# Patient Record
Sex: Male | Born: 1945 | Race: White | Hispanic: No | Marital: Single | Smoking: Never smoker
Health system: Southern US, Community
[De-identification: ages and names within clinical notes are randomized; demographics above are authoritative.]

## PROBLEM LIST (undated history)

## (undated) DIAGNOSIS — I10 Essential (primary) hypertension: Secondary | ICD-10-CM

## (undated) DIAGNOSIS — R7301 Impaired fasting glucose: Secondary | ICD-10-CM

## (undated) DIAGNOSIS — K635 Polyp of colon: Secondary | ICD-10-CM

## (undated) DIAGNOSIS — M1990 Unspecified osteoarthritis, unspecified site: Secondary | ICD-10-CM

## (undated) HISTORY — DX: Essential (primary) hypertension: I10

## (undated) HISTORY — PX: COLONOSCOPY: SHX174

## (undated) HISTORY — DX: Polyp of colon: K63.5

## (undated) HISTORY — DX: Impaired fasting glucose: R73.01

## (undated) HISTORY — PX: OTHER SURGICAL HISTORY: SHX169

## (undated) HISTORY — DX: Unspecified osteoarthritis, unspecified site: M19.90

---

## 2002-01-28 ENCOUNTER — Ambulatory Visit (HOSPITAL_BASED_OUTPATIENT_CLINIC_OR_DEPARTMENT_OTHER): Admission: RE | Admit: 2002-01-28 | Discharge: 2002-01-28 | Payer: Self-pay | Admitting: Orthopedic Surgery

## 2002-01-28 ENCOUNTER — Encounter (INDEPENDENT_AMBULATORY_CARE_PROVIDER_SITE_OTHER): Payer: Self-pay | Admitting: Specialist

## 2004-05-02 ENCOUNTER — Ambulatory Visit: Payer: Self-pay | Admitting: Internal Medicine

## 2004-05-02 ENCOUNTER — Ambulatory Visit (HOSPITAL_COMMUNITY): Admission: RE | Admit: 2004-05-02 | Discharge: 2004-05-02 | Payer: Self-pay | Admitting: Internal Medicine

## 2007-01-07 ENCOUNTER — Ambulatory Visit (HOSPITAL_BASED_OUTPATIENT_CLINIC_OR_DEPARTMENT_OTHER): Admission: RE | Admit: 2007-01-07 | Discharge: 2007-01-08 | Payer: Self-pay | Admitting: Orthopedic Surgery

## 2007-01-07 ENCOUNTER — Encounter (INDEPENDENT_AMBULATORY_CARE_PROVIDER_SITE_OTHER): Payer: Self-pay | Admitting: Orthopedic Surgery

## 2010-10-08 NOTE — Op Note (Signed)
NAMEDEWAN, Richard Becker NO.:  [id number]   MEDICAL RECORD NO.:  [id number]          PATIENT TYPE:  AMB   LOCATION:  DSC                          FACILITY:  [facility]   PHYSICIAN:  Cindee Salt, M.D.       DATE OF BIRTH:  [birth year]   DATE OF PROCEDURE:  01/07/2007  DATE OF DISCHARGE:                               OPERATIVE REPORT   PREOPERATIVE DIAGNOSIS:  Dupuytren's contracture right middle, right  ring finger first web space right hand.   POSTOPERATIVE DIAGNOSIS:  Dupuytren's contracture right middle, right  ring finger first web space right hand.   OPERATION:  Excision palmar fascia with V-Y advancements right middle,  right ring first web space right hand.   ASSISTANT:  Carolyne Fiscal R.N.   ANESTHESIA:  Supraclavicular with general.   ANESTHESIOLOGIST:  Hart Robinsons, M.D.   HISTORY:  The patient is a 65 year old male with a history of  Dupuytren's contracture.  He has also developed a complex regional pain  following excision of his left side.  He is admitted now for removal of  palmar fascia of the middle ring fingers of his right hand with removal  of the cord to his first web space.  He is aware of risks and  complications including infection, recurrence, injury to arteries,  nerves, tendons complete relief of symptoms, dystrophy.  He will be  admitted for overnight stay for pain control and for a block in the  morning.   PROCEDURE IN DETAIL:  In the preoperative area the patient is seen.  The  extremity marked by both the patient and surgeon, questions encouraged  and answered, block given.  He was brought to the operating room where  general anesthetic was carried out without difficulty.  He was prepped  using DuraPrep, supine position, right arm free.  The limb was  exsanguinated with an Esmarch bandage.  Tourniquet placed on the upper  arm was inflated to 250 mmHg.  After adequate anesthesia was afforded, a  volar Brunner incision was made based over  the ring finger, carried down  through subcutaneous tissue.  Bleeders were electrocauterized.  Palmar  fascia was lifted from the superficial volar retinaculum.  This allowed  visualization of the neurovascular bundles beneath this.  The entire  fascia was released proximally.  The cord to the ring finger was then  followed tracing neurovascular bundles, maintaining them intact.  A  spiral nerve and artery was present to the ulnar side of the ring  finger.  This was carefully dissected free, maintained, the dissection  carried out to the PIP joint.  The natatory ligament cord to the middle  finger was also released.  The cord was excised en toto and sent to  pathology.  Neurovascular bundles were explored, found to be intact on  both radial and ulnar side.  A separate incision was then made over the  middle finger, carried down through subcutaneous tissue.  Bleeders again  electrocauterized.  The palmar fascia was isolated and neurovascular  structures protected.  The cord removed to the middle finger to the  proximal phalanx.  This  cord was also sent to pathology.  Separate  zigzag incision was made over the first web space and carried down  through subcutaneous tissue.  The transverse cord was immediately  apparent.  Neurovascular bundle to the index finger identified.  The  cord was then transected, removed and sent to pathology.  The wounds  were copiously irrigated with saline.  The V's were converted to Y's on  the ring finger.  The Vesi-Loops were placed to the base of the wounds  on the middle and ring, brought out proximally.  These were doubled.  The wounds were then closed with interrupted 5-0 Vicryl Rapide sutures  leaving gaps for drainage.  A sterile compressive dressing was applied.  Tourniquet deflated.  All fingers  immediately pinked.  A dorsal splint was then applied.  The patient was  taken to the recovery room for observation in satisfactory condition.  He will be  discharged home in the morning.  He is to be given another  stellate ganglion block prior to discharge.  He will be discharged on  Percocet and Elavil.           ______________________________  Cindee Salt, M.D.     GK/MEDQ  D:  01/07/2007  T:  01/08/2007  Job:  [id number]

## 2010-10-11 NOTE — Op Note (Signed)
[name], [name]             ACCOUNT NO.:  [id number]   MEDICAL RECORD NO.:  [id number]          PATIENT TYPE:  AMB   LOCATION:  DAY                           FACILITY:  [facility]   PHYSICIAN:  Richard Becker, M.D.    DATE OF BIRTH:  06/04/1945   DATE OF PROCEDURE:  05/02/2004  DATE OF DISCHARGE:                                 OPERATIVE REPORT   PROCEDURE:  Total colonoscopy.   INDICATIONS:  Richard Becker is a 65 year old Caucasian male who is referred for  colonoscopy because of history of colonic polyps. His last exam was in  September 1999. His records were not available until after the procedure  today. At any rate, he does not have any GI symptoms. Family history is  negative for colorectal carcinoma.   PREOPERATIVE MEDICATIONS:  Demerol 50 mg IV, Versed 5 mg IV in divided  doses.   FINDINGS:  Procedure performed in endoscopy suite. The patient's vital signs  and O2 saturations were monitored during procedure and remained stable. The  patient was placed in the left lateral position and rectal examination  performed. No abnormality noted on external or digital exam. Olympus video  scope was placed in the rectum and advanced into sigmoid colon and beyond.  Preparation was excellent. Scope was advanced to the cecum which was  identified by appendiceal orifice and ileocecal valve. Picture taken for the  record. As the scope was withdrawn, colonic mucosa was carefully examined  and was normal throughout. The rectal mucosa similarly was normal. Scope was  retroflexed to examine anorectal junction where he had small hemorrhoids  below the dentate line. The scope was straightened and withdrawn. The  patient tolerated the procedure well.   FINAL DIAGNOSIS:  Normal colonoscopy except small external hemorrhoids.   Please note that his records finally were faxed to [place]. They apparently had  been microfilmed. He had 3-mm hyperplastic polyp removed by Dr. Lovell Sheehan in  September 1999.   RECOMMENDATIONS:  Yearly hemoccults. He can wait 10 years before his next  exam.     Richard Becker   NR/MEDQ  D:  05/02/2004  T:  05/02/2004  Job:  [id number]   cc:   Donna Bernard, M.D.  [address]  [place]  [place] [address]  Fax: [phone]

## 2010-10-11 NOTE — Op Note (Signed)
   NAME:  Richard Becker, Richard Becker                 ACCOUNT NO.:  [id number]   MEDICAL RECORD NO.:  [id number]                   PATIENT TYPE:  AMB   LOCATION:  DSC                                  FACILITY:  [facility]   PHYSICIAN:  Nicki Reaper, M.D.                 DATE OF BIRTH:  1946/04/18   DATE OF PROCEDURE:  01/28/2002  DATE OF DISCHARGE:                                 OPERATIVE REPORT   PREOPERATIVE DIAGNOSIS:  Dupuytren's contracture left little finger.   POSTOPERATIVE DIAGNOSIS:  Dupuytren's contracture left little finger.   OPERATION:  Incision palmar fascia with V-Y advancements left little finger.   SURGEON:  Nicki Reaper, M.D.   ASSISTANT:  Suszanne Conners. Julious Oka.   ANESTHESIA:  Axillary block.   ANESTHESIOLOGIST:  Janetta Hora. Gelene Mink, M.D.   HISTORY OF PRESENT ILLNESS:  The patient is a 65 year old male with a  history of Dupuytren's contracture both hands with cords to the right  contracture to the PIP joint of the left little finger.   PROCEDURE:  The patient was brought to the operating room where an axillary  block was carried out without difficulty.  He was prepped and draped using  Betadine scrubbing solution with the left arm free.  A volar Brunner  incision was made, carried down through subcutaneous, bleeders were  electrocauterized, the dissection carried down to the digital nerves  proximally and arteries, these were identified, a cord was present on the  ulnar side from the abduct digiti quinti.  A spiral artery and nerve were  present.  With blunt sharp dissection, this was dissected free to the level  distal to the PIP  joint.  Both radial and ulnar neurovascular bundles were  protected.  The cord was removed which allowed full extension of the PIP  joint.  The wound was irrigated, the V's were converted to Y's, the apexes  advanced into the V's and the skin closed with interrupted 5-0 nylon  sutures.  A sterile compressive dressing was applied,  tourniquet deflated,  all extremities immediately pinked up, a splint was applied.  The patient  tolerated the procedure well and was taken to the recovery room for  observation in satisfactory condition.  He is discharged home to return to  the [facility] [place] in 1 week on Vicodin and Keflex.                                               Nicki Reaper, M.D.    GRK/MEDQ  D:  01/28/2002  T:  01/29/2002  Job:  [id number]

## 2011-03-10 LAB — BASIC METABOLIC PANEL
BUN: 16
CO2: 27
Calcium: 9.7
Chloride: 104
Creatinine, Ser: 1.02
GFR calc Af Amer: >60
GFR calc non Af Amer: >60
Glucose, Bld: 140 — ABNORMAL HIGH
Potassium: 3.6
Sodium: 139

## 2011-03-10 LAB — POCT HEMOGLOBIN-HEMACUE
Hemoglobin: 14.3
Operator id: 123881

## 2012-02-23 DIAGNOSIS — Z79899 Other long term (current) drug therapy: Secondary | ICD-10-CM | POA: Diagnosis not present

## 2012-02-23 DIAGNOSIS — E785 Hyperlipidemia, unspecified: Secondary | ICD-10-CM | POA: Diagnosis not present

## 2012-02-23 DIAGNOSIS — Z125 Encounter for screening for malignant neoplasm of prostate: Secondary | ICD-10-CM | POA: Diagnosis not present

## 2012-03-02 DIAGNOSIS — Z1211 Encounter for screening for malignant neoplasm of colon: Secondary | ICD-10-CM | POA: Diagnosis not present

## 2012-03-02 DIAGNOSIS — Z0000 Encounter for general adult medical examination without abnormal findings: Secondary | ICD-10-CM | POA: Diagnosis not present

## 2012-03-02 DIAGNOSIS — Z23 Encounter for immunization: Secondary | ICD-10-CM | POA: Diagnosis not present

## 2012-09-20 ENCOUNTER — Encounter: Payer: Self-pay | Admitting: *Deleted

## 2012-09-21 ENCOUNTER — Encounter: Payer: Self-pay | Admitting: Family Medicine

## 2012-09-21 ENCOUNTER — Other Ambulatory Visit: Payer: Self-pay | Admitting: Family Medicine

## 2012-09-21 ENCOUNTER — Ambulatory Visit (INDEPENDENT_AMBULATORY_CARE_PROVIDER_SITE_OTHER): Payer: Medicare Other | Admitting: Family Medicine

## 2012-09-21 VITALS — BP 122/78 | Wt 209.8 lb

## 2012-09-21 DIAGNOSIS — E782 Mixed hyperlipidemia: Secondary | ICD-10-CM | POA: Insufficient documentation

## 2012-09-21 DIAGNOSIS — R7301 Impaired fasting glucose: Secondary | ICD-10-CM | POA: Insufficient documentation

## 2012-09-21 DIAGNOSIS — E785 Hyperlipidemia, unspecified: Secondary | ICD-10-CM | POA: Diagnosis not present

## 2012-09-21 DIAGNOSIS — I10 Essential (primary) hypertension: Secondary | ICD-10-CM | POA: Diagnosis not present

## 2012-09-21 DIAGNOSIS — N400 Enlarged prostate without lower urinary tract symptoms: Secondary | ICD-10-CM | POA: Diagnosis not present

## 2012-09-21 MED ORDER — BENAZEPRIL HCL 20 MG PO TABS: 20.0000 mg | ORAL_TABLET | Freq: Every day | ORAL | Status: DC

## 2012-09-21 MED ORDER — AMLODIPINE BESYLATE 5 MG PO TABS: 5.0000 mg | ORAL_TABLET | Freq: Every day | ORAL | Status: DC

## 2012-09-21 MED ORDER — INDAPAMIDE 2.5 MG PO TABS: 2.5000 mg | ORAL_TABLET | ORAL | Status: DC

## 2012-09-21 MED ORDER — DOXAZOSIN MESYLATE 2 MG PO TABS: 2.0000 mg | ORAL_TABLET | Freq: Every day | ORAL | Status: DC

## 2012-09-21 NOTE — Patient Instructions (Signed)
Work hard on exercise and diet.

## 2012-09-21 NOTE — Progress Notes (Signed)
  Subjective:    Patient ID: Richard Becker, male    DOB: 05-27-1945, 67 y.o.   MRN: [id number]  Hypertension This is a recurrent problem. The current episode started more than 1 year ago. The problem is controlled. Pertinent negatives include no headaches. There are no associated agents to hypertension. The current treatment provides moderate improvement. There are no compliance problems.   patient states he occasionally checks his sugars and generally there are in good control. He is compliant with his medications.  Patient notes his Cardura still is helping his urinating. Up about once per night.    Review of Systems  Neurological: Negative for headaches.   rOS otherwise negative.    Objective:   Physical Exam  Alert no acute distress. HEENT normal. Lungs clear. Heart regular in rhythm.      Assessment & Plan:  Impression 1 hypertension good control. #2 impaired fasting glucose results discussed good control. #3 prostate hypertrophy stable on medications.#4 hyperlipidemia patient reminded of elevated numbers. Plan maintain same meds.diet exercise discussed.25 minutes spent most in discussion. WSL

## 2013-02-21 DIAGNOSIS — H524 Presbyopia: Secondary | ICD-10-CM | POA: Diagnosis not present

## 2013-02-21 DIAGNOSIS — H5200 Hypermetropia, unspecified eye: Secondary | ICD-10-CM | POA: Diagnosis not present

## 2013-02-21 DIAGNOSIS — H11159 Pinguecula, unspecified eye: Secondary | ICD-10-CM | POA: Diagnosis not present

## 2013-02-21 DIAGNOSIS — H52229 Regular astigmatism, unspecified eye: Secondary | ICD-10-CM | POA: Diagnosis not present

## 2013-03-09 DIAGNOSIS — Z23 Encounter for immunization: Secondary | ICD-10-CM | POA: Diagnosis not present

## 2013-03-21 ENCOUNTER — Other Ambulatory Visit: Payer: Self-pay | Admitting: Family Medicine

## 2013-03-29 ENCOUNTER — Telehealth: Payer: Self-pay | Admitting: Family Medicine

## 2013-03-29 NOTE — Telephone Encounter (Signed)
Patient has wellness on 04/05/13 he is needing blood work papers before appointment.

## 2013-03-31 ENCOUNTER — Telehealth: Payer: Self-pay | Admitting: Family Medicine

## 2013-03-31 DIAGNOSIS — I10 Essential (primary) hypertension: Secondary | ICD-10-CM

## 2013-03-31 DIAGNOSIS — Z79899 Other long term (current) drug therapy: Secondary | ICD-10-CM | POA: Diagnosis not present

## 2013-03-31 DIAGNOSIS — Z125 Encounter for screening for malignant neoplasm of prostate: Secondary | ICD-10-CM

## 2013-03-31 LAB — BASIC METABOLIC PANEL
BUN: 20 mg/dL (ref 6–23)
CO2: 28 mEq/L (ref 19–32)
Calcium: 10 mg/dL (ref 8.4–10.5)
Chloride: 101 mEq/L (ref 96–112)
Creat: 0.94 mg/dL (ref 0.50–1.35)
Glucose, Bld: 93 mg/dL (ref 70–99)
Potassium: 4 mEq/L (ref 3.5–5.3)
Sodium: 136 mEq/L (ref 135–145)

## 2013-03-31 LAB — LIPID PANEL
Cholesterol: 191 mg/dL (ref 0–200)
HDL: 54 mg/dL (ref >39)
LDL Cholesterol: 125 mg/dL — ABNORMAL HIGH (ref 0–99)
Total CHOL/HDL Ratio: 3.5 Ratio
Triglycerides: 61 mg/dL (ref <150)
VLDL: 12 mg/dL (ref 0–40)

## 2013-03-31 LAB — HEPATIC FUNCTION PANEL
ALT: 16 U/L (ref 0–53)
AST: 19 U/L (ref 0–37)
Albumin: 4.6 g/dL (ref 3.5–5.2)
Alkaline Phosphatase: 57 U/L (ref 39–117)
Bilirubin, Direct: 0.2 mg/dL (ref 0.0–0.3)
Indirect Bilirubin: 0.6 mg/dL (ref 0.0–0.9)
Total Bilirubin: 0.8 mg/dL (ref 0.3–1.2)
Total Protein: 7 g/dL (ref 6.0–8.3)

## 2013-03-31 NOTE — Telephone Encounter (Signed)
Blood work ordered in Epic. Patient was notified.  

## 2013-03-31 NOTE — Telephone Encounter (Signed)
Lip liv m7 psa 

## 2013-03-31 NOTE — Telephone Encounter (Signed)
Patient calling back to check and see if BW paperwork has been done? He would like this paperwork before appointment on 04/05/13.

## 2013-04-01 LAB — PSA: PSA: 3.43 ng/mL (ref <=4.00)

## 2013-04-05 ENCOUNTER — Encounter: Payer: Self-pay | Admitting: Family Medicine

## 2013-04-05 ENCOUNTER — Ambulatory Visit (INDEPENDENT_AMBULATORY_CARE_PROVIDER_SITE_OTHER): Payer: Medicare Other | Admitting: Family Medicine

## 2013-04-05 VITALS — BP 124/80 | Ht 72.0 in | Wt 206.2 lb

## 2013-04-05 DIAGNOSIS — Z0189 Encounter for other specified special examinations: Secondary | ICD-10-CM | POA: Diagnosis not present

## 2013-04-05 DIAGNOSIS — Z0000 Encounter for general adult medical examination without abnormal findings: Secondary | ICD-10-CM

## 2013-04-05 MED ORDER — INDAPAMIDE 2.5 MG PO TABS: ORAL_TABLET | ORAL | Status: DC

## 2013-04-05 MED ORDER — DOXAZOSIN MESYLATE 2 MG PO TABS: ORAL_TABLET | ORAL | Status: DC

## 2013-04-05 MED ORDER — BENAZEPRIL HCL 20 MG PO TABS: 20.0000 mg | ORAL_TABLET | Freq: Every day | ORAL | Status: DC

## 2013-04-05 MED ORDER — AMLODIPINE BESYLATE 5 MG PO TABS: ORAL_TABLET | ORAL | Status: DC

## 2013-04-05 NOTE — Progress Notes (Signed)
Subjective:    Patient ID: Richard Becker, male    DOB: Mar 28, 1946, 67 y.o.   MRN: [id number]  HPI Patient is here today for his medicare wellness exam. He states he has no concerns at this time.  Has already had blood work completed. Exercising regularly, tries to walk unless hard wind blowing Results for orders placed in visit on 03/31/13  LIPID PANEL      Result Value Range   Cholesterol 191  0 - 200 mg/dL   Triglycerides 61  <244 mg/dL   HDL 54  >01 mg/dL   Total CHOL/HDL Ratio 3.5     VLDL 12  0 - 40 mg/dL   LDL Cholesterol 027 (*) 0 - 99 mg/dL  HEPATIC FUNCTION PANEL      Result Value Range   Total Bilirubin 0.8  0.3 - 1.2 mg/dL   Bilirubin, Direct 0.2  0.0 - 0.3 mg/dL   Indirect Bilirubin 0.6  0.0 - 0.9 mg/dL   Alkaline Phosphatase 57  39 - 117 U/L   AST 19  0 - 37 U/L   ALT 16  0 - 53 U/L   Total Protein 7.0  6.0 - 8.3 g/dL   Albumin 4.6  3.5 - 5.2 g/dL  BASIC METABOLIC PANEL      Result Value Range   Sodium 136  135 - 145 mEq/L   Potassium 4.0  3.5 - 5.3 mEq/L   Chloride 101  96 - 112 mEq/L   CO2 28  19 - 32 mEq/L   Glucose, Bld 93  70 - 99 mg/dL   BUN 20  6 - 23 mg/dL   Creat 2.53  6.64 - 4.03 mg/dL   Calcium 47.4  8.4 - 25.9 mg/dL  PSA      Result Value Range   PSA 3.43  <=4.00 ng/mL  urinating once to zero per night   Waist circumference= 40 inches Next colonoscopy due nest yr  Flu shot last month Get up and Go test  = no falls in the pass 6 months  Mini Cog Test = passed  Trying to watch sugar so,  Review of Systems  Constitutional: Negative for fever, activity change and appetite change.  HENT: Negative for congestion and rhinorrhea.   Eyes: Negative for discharge.  Respiratory: Negative for cough and wheezing.   Cardiovascular: Negative for chest pain.  Gastrointestinal: Negative for vomiting, abdominal pain and blood in stool.  Genitourinary: Negative for frequency and difficulty urinating.  Musculoskeletal: Negative for neck pain.   Skin: Negative for rash.  Allergic/Immunologic: Negative for environmental allergies and food allergies.  Neurological: Negative for weakness and headaches.  Psychiatric/Behavioral: Negative for agitation.       Objective:   Physical Exam  Constitutional: He appears well-developed and well-nourished.  HENT:  Head: Normocephalic and atraumatic.  Right Ear: External ear normal.  Left Ear: External ear normal.  Nose: Nose normal.  Mouth/Throat: Oropharynx is clear and moist.  Eyes: EOM are normal. Pupils are equal, round, and reactive to light.  Neck: Normal range of motion. Neck supple. No thyromegaly present.  Cardiovascular: Normal rate, regular rhythm and normal heart sounds.   No murmur heard. Pulmonary/Chest: Effort normal and breath sounds normal. No respiratory distress. He has no wheezes.  Abdominal: Soft. Bowel sounds are normal. He exhibits no distension and no mass. There is no tenderness.  Genitourinary: Prostate normal and penis normal.  Musculoskeletal: Normal range of motion. He exhibits no edema.  Lymphadenopathy:  He has no cervical adenopathy.  Neurological: He is alert. He exhibits normal muscle tone.  Skin: Skin is warm and dry. No erythema.  Psychiatric: He has a normal mood and affect. His behavior is normal. Judgment normal.          Assessment & Plan:  Impression preventive exam. #2 hypertension good control. #3 prostate hypertrophy discussed. Plan maintain same meds. Diet exercise discussed. Check in 6 months. WSL

## 2013-04-11 LAB — POC HEMOCCULT BLD/STL (HOME/3-CARD/SCREEN)
Card #2 Fecal Occult Blod, POC: NEGATIVE
Card #3 Fecal Occult Blood, POC: NEGATIVE
Fecal Occult Blood, POC: NEGATIVE

## 2013-05-09 ENCOUNTER — Telehealth: Payer: Self-pay | Admitting: Family Medicine

## 2013-05-09 NOTE — Telephone Encounter (Signed)
Hemoccult negative x 3. Pt notified.

## 2013-05-09 NOTE — Telephone Encounter (Signed)
Patient calling to get hemocult card results

## 2013-05-09 NOTE — Telephone Encounter (Signed)
PT would like to know results of his Hemoccult cards?

## 2013-05-09 NOTE — Telephone Encounter (Signed)
Discussed with pt. Hemoccult cards were negative.

## 2013-10-18 ENCOUNTER — Other Ambulatory Visit: Payer: Self-pay | Admitting: Family Medicine

## 2013-12-20 ENCOUNTER — Ambulatory Visit (INDEPENDENT_AMBULATORY_CARE_PROVIDER_SITE_OTHER): Payer: Medicare Other | Admitting: Family Medicine

## 2013-12-20 ENCOUNTER — Encounter: Payer: Self-pay | Admitting: Family Medicine

## 2013-12-20 VITALS — BP 142/84 | Ht 72.0 in | Wt 208.0 lb

## 2013-12-20 DIAGNOSIS — N400 Enlarged prostate without lower urinary tract symptoms: Secondary | ICD-10-CM

## 2013-12-20 DIAGNOSIS — E785 Hyperlipidemia, unspecified: Secondary | ICD-10-CM

## 2013-12-20 DIAGNOSIS — I10 Essential (primary) hypertension: Secondary | ICD-10-CM | POA: Diagnosis not present

## 2013-12-20 DIAGNOSIS — R21 Rash and other nonspecific skin eruption: Secondary | ICD-10-CM

## 2013-12-20 MED ORDER — INDAPAMIDE 2.5 MG PO TABS: 2.5000 mg | ORAL_TABLET | Freq: Every day | ORAL | Status: DC

## 2013-12-20 MED ORDER — BENAZEPRIL HCL 20 MG PO TABS: 20.0000 mg | ORAL_TABLET | Freq: Every day | ORAL | Status: DC

## 2013-12-20 MED ORDER — AMLODIPINE BESYLATE 5 MG PO TABS: 5.0000 mg | ORAL_TABLET | Freq: Every day | ORAL | Status: DC

## 2013-12-20 MED ORDER — DOXAZOSIN MESYLATE 2 MG PO TABS: 2.0000 mg | ORAL_TABLET | Freq: Every day | ORAL | Status: DC

## 2013-12-20 MED ORDER — TRIAMCINOLONE ACETONIDE 0.1 % EX CREA: 1.0000 "application " | TOPICAL_CREAM | Freq: Two times a day (BID) | CUTANEOUS | Status: DC

## 2013-12-20 NOTE — Progress Notes (Signed)
   Subjective:    Patient ID: Richard Becker, male    DOB: April 07, 1946, 68 y.o.   MRN: [id number]  Hypertension This is a chronic problem. The current episode started more than 1 year ago.  Walks three times a week. Follows generally healthy diet.  Needs refills on all meds.  BP's generally in good control  Patient has history of hyperlipidemia. Compliant with diet in this regard. LDL improved last visit. See prior notes. Good HDL helps counteract. Discussed with patient.  Out of two meds today   Missed once or so per month   Concerns about itchy skin on chest. Using neosporin and benadryl cream. Not responding to over-the-counter meds. Pretty much in the same spot.  Review of Systems No chest pain no headache no back pain no abdominal pain no change in bowel habits no blood in stool ROS otherwise negative    Objective:   Physical Exam  Alert no apparent distress HEENT normal. Blood pressure good on repeat. Lungs clear heart regular in rhythm. Superior chest hyperemic patch with scaling S. Not distinct edges.      Assessment & Plan:  Impression 1 hypertension good control. #2 patch of eczema discussed. #3 hyperlipidemia discussed. Plan maintain all meds. Diet exercise discussed. Recheck in 6 months. Exercise strongly encourage. No medications for lipids. Triamcinolone cream twice a day for truncal rash. WSL

## 2014-02-20 ENCOUNTER — Ambulatory Visit (INDEPENDENT_AMBULATORY_CARE_PROVIDER_SITE_OTHER): Payer: Medicare Other | Admitting: Family Medicine

## 2014-02-20 ENCOUNTER — Encounter: Payer: Self-pay | Admitting: Family Medicine

## 2014-02-20 ENCOUNTER — Telehealth: Payer: Self-pay | Admitting: Family Medicine

## 2014-02-20 VITALS — BP 142/80 | Temp 98.3°F | Ht 72.0 in | Wt 214.0 lb

## 2014-02-20 DIAGNOSIS — Z125 Encounter for screening for malignant neoplasm of prostate: Secondary | ICD-10-CM

## 2014-02-20 DIAGNOSIS — I10 Essential (primary) hypertension: Secondary | ICD-10-CM

## 2014-02-20 DIAGNOSIS — Z23 Encounter for immunization: Secondary | ICD-10-CM | POA: Diagnosis not present

## 2014-02-20 DIAGNOSIS — Z79899 Other long term (current) drug therapy: Secondary | ICD-10-CM

## 2014-02-20 MED ORDER — MOMETASONE FUROATE 0.1 % EX CREA: 1.0000 "application " | TOPICAL_CREAM | Freq: Every day | CUTANEOUS | Status: DC

## 2014-02-20 MED ORDER — PREDNISONE 10 MG PO TABS: ORAL_TABLET | ORAL | Status: DC

## 2014-02-20 NOTE — Telephone Encounter (Signed)
Notified patient that bloodwork has been ordered.  

## 2014-02-20 NOTE — Telephone Encounter (Signed)
Lip liv m7 psa 

## 2014-02-20 NOTE — Telephone Encounter (Signed)
Patient needs order for blood work for upcoming PE.

## 2014-02-20 NOTE — Progress Notes (Signed)
   Subjective:    Patient ID: Richard Becker, male    DOB: 1945-10-29, 68 y.o.   MRN: [id number]  Rash This is a new problem. The current episode started 1 to 4 weeks ago. Pain location: arms, chest, back, stomach and legs. The rash is characterized by itchiness. Treatments tried: OTC salves and creams.   Had a small patch  Under the arm a nd triamcin fixed it up.  Now spreading elsewhere pretty bad this wks  No tick born illnesses far as patient knows  Review of Systems  Skin: Positive for rash.   no headache no chest pain     Objective:   Physical Exam Alert vitals stable. Blood pressure initially elevated improved on repeat. Lungs clear. Heart rare rhythm multiple discrete patches on trunk arms clearly paretic       Assessment & Plan:  Impression contact dermatitis plan prednisone taper. Elocon cream. Local measures discussed. WSL

## 2014-04-13 ENCOUNTER — Encounter: Payer: Medicare Other | Admitting: Family Medicine

## 2014-04-13 ENCOUNTER — Other Ambulatory Visit: Payer: Self-pay | Admitting: Family Medicine

## 2014-04-13 DIAGNOSIS — I10 Essential (primary) hypertension: Secondary | ICD-10-CM | POA: Diagnosis not present

## 2014-04-13 DIAGNOSIS — Z125 Encounter for screening for malignant neoplasm of prostate: Secondary | ICD-10-CM | POA: Diagnosis not present

## 2014-04-13 DIAGNOSIS — Z79899 Other long term (current) drug therapy: Secondary | ICD-10-CM | POA: Diagnosis not present

## 2014-04-14 LAB — HEPATIC FUNCTION PANEL
ALT: 27 U/L (ref 0–53)
AST: 24 U/L (ref 0–37)
Albumin: 4.4 g/dL (ref 3.5–5.2)
Alkaline Phosphatase: 52 U/L (ref 39–117)
Bilirubin, Direct: 0.2 mg/dL (ref 0.0–0.3)
Indirect Bilirubin: 0.5 mg/dL (ref 0.2–1.2)
Total Bilirubin: 0.7 mg/dL (ref 0.2–1.2)
Total Protein: 6.8 g/dL (ref 6.0–8.3)

## 2014-04-14 LAB — BASIC METABOLIC PANEL
BUN: 14 mg/dL (ref 6–23)
CO2: 25 mEq/L (ref 19–32)
Calcium: 9.6 mg/dL (ref 8.4–10.5)
Chloride: 101 mEq/L (ref 96–112)
Creat: 0.98 mg/dL (ref 0.50–1.35)
Glucose, Bld: 97 mg/dL (ref 70–99)
Potassium: 3.9 mEq/L (ref 3.5–5.3)
Sodium: 138 mEq/L (ref 135–145)

## 2014-04-14 LAB — LIPID PANEL
Cholesterol: 200 mg/dL (ref 0–200)
HDL: 51 mg/dL (ref >39)
LDL Cholesterol: 130 mg/dL — ABNORMAL HIGH (ref 0–99)
Total CHOL/HDL Ratio: 3.9 Ratio
Triglycerides: 94 mg/dL (ref <150)
VLDL: 19 mg/dL (ref 0–40)

## 2014-04-14 LAB — PSA: PSA: 3.26 ng/mL (ref <=4.00)

## 2014-04-25 ENCOUNTER — Ambulatory Visit (INDEPENDENT_AMBULATORY_CARE_PROVIDER_SITE_OTHER): Payer: Medicare Other | Admitting: Family Medicine

## 2014-04-25 ENCOUNTER — Encounter: Payer: Self-pay | Admitting: Family Medicine

## 2014-04-25 VITALS — BP 128/82 | Ht 72.0 in | Wt 213.0 lb

## 2014-04-25 DIAGNOSIS — Z23 Encounter for immunization: Secondary | ICD-10-CM | POA: Diagnosis not present

## 2014-04-25 DIAGNOSIS — Z0000 Encounter for general adult medical examination without abnormal findings: Secondary | ICD-10-CM | POA: Diagnosis not present

## 2014-04-25 DIAGNOSIS — I10 Essential (primary) hypertension: Secondary | ICD-10-CM

## 2014-04-25 NOTE — Progress Notes (Signed)
Subjective:    Patient ID: Richard Becker, male    DOB: Dec 12, 1945, 68 y.o.   MRN: [id number]  HPI AWV- Annual Wellness Visit  The patient was seen for their annual wellness visit. The patient's past medical history, surgical history, and family history were reviewed. Pertinent vaccines were reviewed ( tetanus, pneumonia, shingles, flu) The patient's medication list was reviewed and updated.  The height and weight were entered. The patient's current BMI is: 28  Cognitive screening was completed. Outcome of Mini - Cog: Pass  Falls within the past 6 months:No  Current tobacco usage: No (All patients who use tobacco were given written and verbal information on quitting)  Recent listing of emergency department/hospitalizations over the past year were reviewed.  current specialist the patient sees on a regular basis: No   Medicare annual wellness visit patient questionnaire was reviewed.  A written screening schedule for the patient for the next 5-10 years was given. Appropriate discussion of followup regarding next visit was discussed.  Results for orders placed or performed in visit on 04/13/14  Basic metabolic panel  Result Value Ref Range   Sodium 138 135 - 145 mEq/L   Potassium 3.9 3.5 - 5.3 mEq/L   Chloride 101 96 - 112 mEq/L   CO2 25 19 - 32 mEq/L   Glucose, Bld 97 70 - 99 mg/dL   BUN 14 6 - 23 mg/dL   Creat 4.090.98 8.110.50 - 9.141.35 mg/dL   Calcium 9.6 8.4 - 78.210.5 mg/dL  Lipid panel  Result Value Ref Range   Cholesterol 200 0 - 200 mg/dL   Triglycerides 94 <956<150 mg/dL   HDL 51 >21>39 mg/dL   Total CHOL/HDL Ratio 3.9 Ratio   VLDL 19 0 - 40 mg/dL   LDL Cholesterol 308130 (H) 0 - 99 mg/dL  Hepatic function panel  Result Value Ref Range   Total Bilirubin 0.7 0.2 - 1.2 mg/dL   Bilirubin, Direct 0.2 0.0 - 0.3 mg/dL   Indirect Bilirubin 0.5 0.2 - 1.2 mg/dL   Alkaline Phosphatase 52 39 - 117 U/L   AST 24 0 - 37 U/L   ALT 27 0 - 53 U/L   Total Protein 6.8 6.0 - 8.3 g/dL   Albumin 4.4 3.5 - 5.2 g/dL  PSA  Result Value Ref Range   PSA 3.26 <=4.00 ng/mL   [place] food and french fries too much of these day  Last colonoscopy done by dr Karilyn Cotarehman in 2005  Flu shot already given    Walking three to four times per day  Still golfing regularly    Compliant with blood pressure medicine. No obvious side effects. Watching salt intake. Mostly exercising regularly. Review of Systems  Constitutional: Negative for fever, activity change and appetite change.  HENT: Negative for congestion and rhinorrhea.   Eyes: Negative for discharge.  Respiratory: Negative for cough and wheezing.   Cardiovascular: Negative for chest pain.  Gastrointestinal: Negative for vomiting, abdominal pain and blood in stool.  Genitourinary: Negative for frequency and difficulty urinating.  Musculoskeletal: Negative for neck pain.  Skin: Negative for rash.  Allergic/Immunologic: Negative for environmental allergies and food allergies.  Neurological: Negative for weakness and headaches.  Psychiatric/Behavioral: Negative for agitation.  All other systems reviewed and are negative.      Objective:   Physical Exam  Constitutional: He appears well-developed and well-nourished.  HENT:  Head: Normocephalic and atraumatic.  Right Ear: External ear normal.  Left Ear: External ear normal.  Nose: Nose normal.  Mouth/Throat: Oropharynx is clear and moist.  Eyes: EOM are normal. Pupils are equal, round, and reactive to light.  Neck: Normal range of motion. Neck supple. No thyromegaly present.  Cardiovascular: Normal rate, regular rhythm and normal heart sounds.   No murmur heard. Pulmonary/Chest: Effort normal and breath sounds normal. No respiratory distress. He has no wheezes.  Abdominal: Soft. Bowel sounds are normal. He exhibits no distension and no mass. There is no tenderness.  Genitourinary: Penis normal.  Musculoskeletal: Normal range of motion. He exhibits no edema.  Lymphadenopathy:      He has no cervical adenopathy.  Neurological: He is alert. He exhibits normal muscle tone.  Skin: Skin is warm and dry. No erythema.  Psychiatric: He has a normal mood and affect. His behavior is normal. Judgment normal.  Vitals reviewed.         Assessment & Plan:  Impression #1 wellness exam #2 hypertension good control. On current medications. plan diet discussed. Exercise discussed. Maintain same medications. Patient to call GI for colonoscopy. Recheck in 6 months. WSL

## 2014-04-27 ENCOUNTER — Other Ambulatory Visit (INDEPENDENT_AMBULATORY_CARE_PROVIDER_SITE_OTHER): Payer: Self-pay | Admitting: *Deleted

## 2014-04-27 DIAGNOSIS — Z1211 Encounter for screening for malignant neoplasm of colon: Secondary | ICD-10-CM

## 2014-05-03 ENCOUNTER — Ambulatory Visit (INDEPENDENT_AMBULATORY_CARE_PROVIDER_SITE_OTHER): Payer: Medicare Other | Admitting: Family Medicine

## 2014-05-03 ENCOUNTER — Encounter: Payer: Self-pay | Admitting: Family Medicine

## 2014-05-03 VITALS — Temp 98.2°F | Ht 72.0 in | Wt 215.0 lb

## 2014-05-03 DIAGNOSIS — B9689 Other specified bacterial agents as the cause of diseases classified elsewhere: Secondary | ICD-10-CM

## 2014-05-03 DIAGNOSIS — J019 Acute sinusitis, unspecified: Secondary | ICD-10-CM

## 2014-05-03 MED ORDER — AMOXICILLIN 500 MG PO TABS: 500.0000 mg | ORAL_TABLET | Freq: Two times a day (BID) | ORAL | Status: DC

## 2014-05-03 NOTE — Progress Notes (Signed)
   Subjective:    Patient ID: Richard Becker, male    DOB: April 27, 1946, 68 y.o.   MRN: [id number]  Sinusitis This is a new problem. Episode onset: Saturday. The problem has been gradually worsening since onset. There has been no fever. Associated symptoms include congestion, coughing, headaches, sinus pressure and a sore throat. Pertinent negatives include no ear pain. Past treatments include oral decongestants. The treatment provided mild relief.    PMH benign  Review of Systems  Constitutional: Negative for fever and activity change.  HENT: Positive for congestion, rhinorrhea, sinus pressure and sore throat. Negative for ear pain.   Eyes: Negative for discharge.  Respiratory: Positive for cough. Negative for wheezing.   Cardiovascular: Negative for chest pain.  Neurological: Positive for headaches.       Objective:   Physical Exam  Constitutional: He appears well-developed.  HENT:  Head: Normocephalic.  Mouth/Throat: Oropharynx is clear and moist. No oropharyngeal exudate.  Neck: Normal range of motion.  Cardiovascular: Normal rate, regular rhythm and normal heart sounds.   No murmur heard. Pulmonary/Chest: Effort normal and breath sounds normal. He has no wheezes.  Lymphadenopathy:    He has no cervical adenopathy.  Neurological: He exhibits normal muscle tone.  Skin: Skin is warm and dry.  Nursing note and vitals reviewed.         Assessment & Plan:  Viral syndrome Acute rhinosinusitis secondary to bacterial component Patient not toxic Antibiotics prescribed Warning signs discussed.

## 2014-05-30 ENCOUNTER — Telehealth (INDEPENDENT_AMBULATORY_CARE_PROVIDER_SITE_OTHER): Payer: Self-pay | Admitting: *Deleted

## 2014-05-30 DIAGNOSIS — Z1211 Encounter for screening for malignant neoplasm of colon: Secondary | ICD-10-CM

## 2014-05-30 NOTE — Telephone Encounter (Signed)
Patient needs movi prep 

## 2014-06-02 MED ORDER — PEG-KCL-NACL-NASULF-NA ASC-C 100 G PO SOLR
1.0000 | Freq: Once | ORAL | Status: DC
Start: 2014-06-02 — End: 2014-11-21

## 2014-06-09 ENCOUNTER — Encounter (INDEPENDENT_AMBULATORY_CARE_PROVIDER_SITE_OTHER): Payer: Self-pay | Admitting: *Deleted

## 2014-06-20 ENCOUNTER — Telehealth (INDEPENDENT_AMBULATORY_CARE_PROVIDER_SITE_OTHER): Payer: Self-pay | Admitting: *Deleted

## 2014-06-20 ENCOUNTER — Other Ambulatory Visit: Payer: Self-pay | Admitting: Family Medicine

## 2014-06-20 NOTE — Telephone Encounter (Signed)
agree

## 2014-06-20 NOTE — Telephone Encounter (Signed)
Referring MD/PCP: steve luking   Procedure: tcs  Reason/Indication:  screening  Has patient had this procedure before?  Yes, 2005 -- epic  If so, when, by whom and where?    Is there a family history of colon cancer?  no  Who?  What age when diagnosed?    Is patient diabetic?   no      Does patient have prosthetic heart valve?  no  Do you have a pacemaker?  no  Has patient ever had endocarditis? no  Has patient had joint replacement within last 12 months?  no  Does patient tend to be constipated or take laxatives? no  Is patient on Coumadin, Plavix and/or Aspirin? no  Medications: indapamide 2.5 mg daily, amlodipine 5 mg daily, benazepril 20 mg daily, doxazosin 2 mg daily  Allergies: nkda  Medication Adjustment:   Procedure date & time: 07/20/14 at 830

## 2014-07-20 ENCOUNTER — Encounter (HOSPITAL_COMMUNITY)
Admission: RE | Disposition: A | Discharge status: Home / Self Care | Payer: Self-pay | Source: Ambulatory Visit | Attending: Internal Medicine

## 2014-07-20 ENCOUNTER — Ambulatory Visit (HOSPITAL_COMMUNITY)
Admission: RE | Admit: 2014-07-20 | Discharge: 2014-07-20 | Disposition: A | Discharge status: Home / Self Care | Payer: Medicare Other | Source: Ambulatory Visit | Attending: Internal Medicine | Admitting: Internal Medicine

## 2014-07-20 ENCOUNTER — Encounter (HOSPITAL_COMMUNITY): Payer: Self-pay | Admitting: *Deleted

## 2014-07-20 DIAGNOSIS — Z1211 Encounter for screening for malignant neoplasm of colon: Secondary | ICD-10-CM

## 2014-07-20 DIAGNOSIS — Z79899 Other long term (current) drug therapy: Secondary | ICD-10-CM | POA: Diagnosis not present

## 2014-07-20 DIAGNOSIS — M1990 Unspecified osteoarthritis, unspecified site: Secondary | ICD-10-CM | POA: Diagnosis not present

## 2014-07-20 DIAGNOSIS — I10 Essential (primary) hypertension: Secondary | ICD-10-CM | POA: Diagnosis not present

## 2014-07-20 DIAGNOSIS — K644 Residual hemorrhoidal skin tags: Secondary | ICD-10-CM | POA: Insufficient documentation

## 2014-07-20 DIAGNOSIS — K648 Other hemorrhoids: Secondary | ICD-10-CM | POA: Diagnosis not present

## 2014-07-20 DIAGNOSIS — K649 Unspecified hemorrhoids: Secondary | ICD-10-CM | POA: Diagnosis not present

## 2014-07-20 HISTORY — PX: COLONOSCOPY: SHX5424

## 2014-07-20 SURGERY — COLONOSCOPY
Anesthesia: Moderate Sedation

## 2014-07-20 MED ORDER — MIDAZOLAM HCL 5 MG/5ML IJ SOLN
INTRAMUSCULAR | Status: DC | PRN
  Administered 2014-07-20 (×3): 2 mg via INTRAVENOUS

## 2014-07-20 MED ORDER — SODIUM CHLORIDE 0.9 % IV SOLN
INTRAVENOUS | Status: DC
  Administered 2014-07-20: 08:00:00 via INTRAVENOUS

## 2014-07-20 MED ORDER — MEPERIDINE HCL 50 MG/ML IJ SOLN
INTRAMUSCULAR | Status: DC | PRN
  Administered 2014-07-20 (×2): 25 mg via INTRAVENOUS

## 2014-07-20 MED ORDER — STERILE WATER FOR IRRIGATION IR SOLN
Status: DC | PRN
  Administered 2014-07-20: 09:00:00

## 2014-07-20 MED ORDER — MEPERIDINE HCL 50 MG/ML IJ SOLN
INTRAMUSCULAR | Status: AC
  Filled 2014-07-20: qty 1

## 2014-07-20 MED ORDER — MIDAZOLAM HCL 5 MG/5ML IJ SOLN
INTRAMUSCULAR | Status: AC
  Filled 2014-07-20: qty 10

## 2014-07-20 NOTE — Discharge Instructions (Signed)
Resume usual medications and diet. °No driving for 24 hours. °Next screening exam in 10 years. ° ° °Colonoscopy, Care After °Refer to this sheet in the next few weeks. These instructions provide you with information on caring for yourself after your procedure. Your health care provider may also give you more specific instructions. Your treatment has been planned according to current medical practices, but problems sometimes occur. Call your health care provider if you have any problems or questions after your procedure. °WHAT TO EXPECT AFTER THE PROCEDURE  °After your procedure, it is typical to have the following: °· A small amount of blood in your stool. °· Moderate amounts of gas and mild abdominal cramping or bloating. °HOME CARE INSTRUCTIONS °· Do not drive, operate machinery, or sign important documents for 24 hours. °· You may shower and resume your regular physical activities, but move at a slower pace for the first 24 hours. °· Take frequent rest periods for the first 24 hours. °· Walk around or put a warm pack on your abdomen to help reduce abdominal cramping and bloating. °· Drink enough fluids to keep your urine clear or pale yellow. °· You may resume your normal diet as instructed by your health care provider. Avoid heavy or fried foods that are hard to digest. °· Avoid drinking alcohol for 24 hours or as instructed by your health care provider. °· Only take over-the-counter or prescription medicines as directed by your health care provider. °· If a tissue sample (biopsy) was taken during your procedure: °¨ Do not take aspirin or blood thinners for 7 days, or as instructed by your health care provider. °¨ Do not drink alcohol for 7 days, or as instructed by your health care provider. °¨ Eat soft foods for the first 24 hours. °SEEK MEDICAL CARE IF: °You have persistent spotting of blood in your stool 2-3 days after the procedure. °SEEK IMMEDIATE MEDICAL CARE IF: °· You have more than a small spotting of  blood in your stool. °· You pass large blood clots in your stool. °· Your abdomen is swollen (distended). °· You have nausea or vomiting. °· You have a fever. °· You have increasing abdominal pain that is not relieved with medicine. °Document Released: 12/25/2003 Document Revised: 03/02/2013 Document Reviewed: 01/17/2013 °Exit[employer]® Patient Information ©2015 [employer]. This information is not intended to replace advice given to you by your health care provider. Make sure you discuss any questions you have with your health care provider. ° °

## 2014-07-20 NOTE — H&P (Signed)
Richard Becker is an 69 y.o. male.   Chief Complaint: Patient is here for colonoscopy. HPI: Patient is 69 year old Caucasian male who is in for screening colonoscopy. He denies abdominal pain change in bowel habits or rectal bleeding. Last colonoscopy was in December 2005 and was normal. He had small polyp removed on prior colonoscopy of 1999 but it was hyperplastic. Family history is negative for CRC.  Past Medical History  Diagnosis Date  . Hypertension   . Arthritis   . IFG (impaired fasting glucose)   . Colon polyp--hyperplastic polyp removed in 1999      Past Surgical History  Procedure Laterality Date  . Colonoscopy    . Bilateral hand surgery      Family History  Problem Relation Age of Onset  . Cancer Mother     Breast  . Heart attack Father    Social History:  reports that he has never smoked. He does not have any smokeless tobacco history on file. He reports that he does not drink alcohol or use illicit drugs.  Allergies: No Known Allergies  Medications Prior to Admission  Medication Sig Dispense Refill  . amLODipine (NORVASC) 5 MG tablet TAKE (1) TABLET BY MOUTH DAILY. 30 tablet 2  . amoxicillin (AMOXIL) 500 MG tablet Take 1 tablet (500 mg total) by mouth 2 (two) times daily. 30 tablet 0  . benazepril (LOTENSIN) 20 MG tablet TAKE (1) TABLET BY MOUTH DAILY. 30 tablet 2  . doxazosin (CARDURA) 2 MG tablet TAKE 1 TABLET BY MOUTH ONCE DAILY. 30 tablet 2  . Garlic 401 MG CAPS Take 600 mg by mouth.    . indapamide (LOZOL) 2.5 MG tablet TAKE 1 TABLET BY MOUTH ONCE DAILY. 30 tablet 2  . mometasone (ELOCON) 0.1 % cream Apply 1 application topically daily. (Patient not taking: Reported on 05/03/2014) 50 g 2  . peg 3350 powder (MOVIPREP) 100 G SOLR Take 1 kit (200 g total) by mouth once. 1 kit 0  . triamcinolone cream (KENALOG) 0.1 % Apply 1 application topically 2 (two) times daily. (Patient not taking: Reported on 05/03/2014) 30 g 0    No results found for this or any  previous visit (from the past 48 hour(s)). No results found.  ROS  Blood pressure 157/90, pulse 87, temperature 97.7 F (36.5 C), temperature source Oral, height 6' (1.829 m), weight 210 lb (95.255 kg), SpO2 99 %. Physical Exam  Constitutional: He appears well-developed and well-nourished.  HENT:  Mouth/Throat: Oropharynx is clear and moist.  Eyes: Conjunctivae are normal.  Neck: No thyromegaly present.  Cardiovascular: Normal rate, regular rhythm and normal heart sounds.   No murmur heard. Respiratory: Effort normal and breath sounds normal.  GI: Soft. He exhibits no distension and no mass. There is no tenderness.  Musculoskeletal: He exhibits no edema.  Lymphadenopathy:    He has no cervical adenopathy.  Neurological: He is alert.  Skin: Skin is warm and dry.     Assessment/Plan Average risk screening colonoscopy.  Richard Becker U 07/20/2014, 8:24 AM

## 2014-07-20 NOTE — Op Note (Signed)
COLONOSCOPY PROCEDURE REPORT  PATIENT:  Richard DeistCharles R Cieslewicz  MR#:  [id number] Birthdate:  1946/03/10, 69 y.o., male Endoscopist:  Dr. Malissa HippoNajeeb U. Chatham Howington, MD Referred By:  Dr. Vilinda BlanksWilliam S. looking, MD  Procedure Date: 07/20/2014  Procedure:   Colonoscopy  Indications:  Patient is 69 year old Caucasian male who is undergoing average risk screening colonoscopy. His last exam was in December 2005 and was normal.  Informed Consent:  The procedure and risks were reviewed with the patient and informed consent was obtained.  Medications:  Demerol 50 mg IV Versed 6 mg IV  Description of procedure:  After a digital rectal exam was performed, that colonoscope was advanced from the anus through the rectum and colon to the area of the cecum, ileocecal valve and appendiceal orifice. The cecum was deeply intubated. These structures were well-seen and photographed for the record. From the level of the cecum and ileocecal valve, the scope was slowly and cautiously withdrawn. The mucosal surfaces were carefully surveyed utilizing scope tip to flexion to facilitate fold flattening as needed. The scope was pulled down into the rectum where a thorough exam including retroflexion was performed.  Findings:   Prep excellent. Normal mucosa of cecum, ascending colon, hepatic flexure, colon, splenic flexure, descending and sigmoid colon. Normal rectal mucosa. Small hemorrhoids are low the dentate line and single anal papilla.   Therapeutic/Diagnostic Maneuvers Performed:   None  Complications:  None  Cecal Withdrawal Time:  10 minutes  Impression:  Examination performed to cecum. Normal colonoscopy except small external hemorrhoids and single anal papilla.  Recommendations:  Standard instructions given. Next screening exam in 10 years.  Andora Krull U  07/20/2014 8:59 AM  CC: Dr. Harlow AsaLUKING,W S, MD & Dr. Bonnetta BarryNo ref. provider found

## 2014-07-21 ENCOUNTER — Encounter (HOSPITAL_COMMUNITY): Payer: Self-pay | Admitting: Internal Medicine

## 2014-09-21 ENCOUNTER — Other Ambulatory Visit: Payer: Self-pay | Admitting: Family Medicine

## 2014-10-16 ENCOUNTER — Other Ambulatory Visit: Payer: Self-pay | Admitting: Family Medicine

## 2014-11-21 ENCOUNTER — Ambulatory Visit (INDEPENDENT_AMBULATORY_CARE_PROVIDER_SITE_OTHER): Payer: Medicare Other | Admitting: Family Medicine

## 2014-11-21 ENCOUNTER — Encounter: Payer: Self-pay | Admitting: Family Medicine

## 2014-11-21 VITALS — BP 122/78 | Ht 72.0 in | Wt 210.0 lb

## 2014-11-21 DIAGNOSIS — N400 Enlarged prostate without lower urinary tract symptoms: Secondary | ICD-10-CM | POA: Diagnosis not present

## 2014-11-21 DIAGNOSIS — I10 Essential (primary) hypertension: Secondary | ICD-10-CM | POA: Diagnosis not present

## 2014-11-21 DIAGNOSIS — M720 Palmar fascial fibromatosis [Dupuytren]: Secondary | ICD-10-CM

## 2014-11-21 MED ORDER — BENAZEPRIL HCL 20 MG PO TABS: ORAL_TABLET | ORAL | Status: DC

## 2014-11-21 MED ORDER — INDAPAMIDE 2.5 MG PO TABS: 2.5000 mg | ORAL_TABLET | Freq: Every day | ORAL | Status: DC

## 2014-11-21 MED ORDER — DOXAZOSIN MESYLATE 2 MG PO TABS: 2.0000 mg | ORAL_TABLET | Freq: Every day | ORAL | Status: DC

## 2014-11-21 MED ORDER — AMLODIPINE BESYLATE 5 MG PO TABS: ORAL_TABLET | ORAL | Status: DC

## 2014-11-21 NOTE — Progress Notes (Signed)
   Subjective:    Patient ID: Richard Becker R [place], male    DOB: 08-10-1945, 69 y.o.   MRN: [id number]  Hypertension This is a chronic problem. The current episode started more than 1 year ago. The problem has been gradually improving since onset. There are no associated agents to hypertension. There are no known risk factors for coronary artery disease. Treatments tried: amlodipine. The current treatment provides moderate improvement. There are no compliance problems.    Patient has no concerns at this time.   Still walking refgulalry   Staying active in the yard  Working on diet pretty fair   Plays golf regulally  Left hand repeating with contractures  Once or twice or none to eat  Patient reports she is up 1-2 times per night to urinate. Not interested in any additional medicine this time.  Reports progressive difficulties with left hand contracture. History of Dupuytren's contractures. Has had surgery in the past. 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 Patient very reluctant to have further intervention  Review of Systems No headache no chest pain no back pain abdominal pain no change in bowel habits    Objective:   Physical Exam  Alert vitals stable blood pressure 138/84 HEENT normal. Lungs clear. Heart regular in rhythm. Ankles without edema.  Left hand impressive contracture. Middle finger fixed in flexion    Assessment & Plan:  Impression 1 hypertension good control #2 prostate hypertrophy discussed manageable for now #3 contracture left hand patient reluctant to press on with further intervention understandably at this time. Plan all medications refilled. Diet exercise discussed. Follow-up in 6 months. WSL

## 2015-04-04 DIAGNOSIS — Z23 Encounter for immunization: Secondary | ICD-10-CM | POA: Diagnosis not present

## 2015-04-20 ENCOUNTER — Other Ambulatory Visit: Payer: Self-pay | Admitting: Family Medicine

## 2015-04-25 ENCOUNTER — Telehealth: Payer: Self-pay | Admitting: Family Medicine

## 2015-04-25 DIAGNOSIS — Z125 Encounter for screening for malignant neoplasm of prostate: Secondary | ICD-10-CM

## 2015-04-25 DIAGNOSIS — R5383 Other fatigue: Secondary | ICD-10-CM

## 2015-04-25 DIAGNOSIS — Z79899 Other long term (current) drug therapy: Secondary | ICD-10-CM

## 2015-04-25 DIAGNOSIS — I10 Essential (primary) hypertension: Secondary | ICD-10-CM

## 2015-04-25 NOTE — Telephone Encounter (Signed)
CBC, lipid, liver, metabolic 7, PSA 

## 2015-04-25 NOTE — Telephone Encounter (Signed)
Pt is requesting lab orders to be sent over for his upcoming appt. Last labs per epic were: bmp,lipid,hepatic,and psa on 04/13/14

## 2015-04-26 NOTE — Telephone Encounter (Signed)
Notified patient bloodwork has been ordered.  

## 2015-04-30 ENCOUNTER — Encounter: Payer: Medicare Other | Admitting: Family Medicine

## 2015-05-21 ENCOUNTER — Other Ambulatory Visit: Payer: Self-pay | Admitting: Family Medicine

## 2015-05-31 DIAGNOSIS — Z79899 Other long term (current) drug therapy: Secondary | ICD-10-CM | POA: Diagnosis not present

## 2015-05-31 DIAGNOSIS — I10 Essential (primary) hypertension: Secondary | ICD-10-CM | POA: Diagnosis not present

## 2015-05-31 DIAGNOSIS — Z125 Encounter for screening for malignant neoplasm of prostate: Secondary | ICD-10-CM | POA: Diagnosis not present

## 2015-05-31 DIAGNOSIS — R5383 Other fatigue: Secondary | ICD-10-CM | POA: Diagnosis not present

## 2015-06-01 LAB — HEPATIC FUNCTION PANEL
ALT: 16 IU/L (ref 0–44)
AST: 17 IU/L (ref 0–40)
Albumin: 4.6 g/dL (ref 3.6–4.8)
Alkaline Phosphatase: 61 IU/L (ref 39–117)
Bilirubin Total: 0.5 mg/dL (ref 0.0–1.2)
Bilirubin, Direct: 0.15 mg/dL (ref 0.00–0.40)
Total Protein: 6.9 g/dL (ref 6.0–8.5)

## 2015-06-01 LAB — LIPID PANEL
Chol/HDL Ratio: 3.1 ratio units (ref 0.0–5.0)
Cholesterol, Total: 177 mg/dL (ref 100–199)
HDL: 58 mg/dL (ref >39)
LDL Calculated: 108 mg/dL — ABNORMAL HIGH (ref 0–99)
Triglycerides: 53 mg/dL (ref 0–149)
VLDL Cholesterol Cal: 11 mg/dL (ref 5–40)

## 2015-06-01 LAB — CBC WITH DIFFERENTIAL/PLATELET
Basophils Absolute: 0 10*3/uL (ref 0.0–0.2)
Basos: 1 %
EOS (ABSOLUTE): 0.2 10*3/uL (ref 0.0–0.4)
Eos: 3 %
Hematocrit: 39.3 % (ref 37.5–51.0)
Hemoglobin: 13.5 g/dL (ref 12.6–17.7)
Immature Grans (Abs): 0 10*3/uL (ref 0.0–0.1)
Immature Granulocytes: 0 %
Lymphocytes Absolute: 1.1 10*3/uL (ref 0.7–3.1)
Lymphs: 19 %
MCH: 29.1 pg (ref 26.6–33.0)
MCHC: 34.4 g/dL (ref 31.5–35.7)
MCV: 85 fL (ref 79–97)
Monocytes Absolute: 0.5 10*3/uL (ref 0.1–0.9)
Monocytes: 9 %
Neutrophils Absolute: 4 10*3/uL (ref 1.4–7.0)
Neutrophils: 68 %
Platelets: 289 10*3/uL (ref 150–379)
RBC: 4.64 x10E6/uL (ref 4.14–5.80)
RDW: 13.5 % (ref 12.3–15.4)
WBC: 5.9 10*3/uL (ref 3.4–10.8)

## 2015-06-01 LAB — PSA: Prostate Specific Ag, Serum: 3.3 ng/mL (ref 0.0–4.0)

## 2015-06-01 LAB — BASIC METABOLIC PANEL
BUN/Creatinine Ratio: 18 (ref 10–22)
BUN: 18 mg/dL (ref 8–27)
CO2: 26 mmol/L (ref 18–29)
Calcium: 9.5 mg/dL (ref 8.6–10.2)
Chloride: 99 mmol/L (ref 96–106)
Creatinine, Ser: 1 mg/dL (ref 0.76–1.27)
GFR calc Af Amer: 89 mL/min/{1.73_m2} (ref >59)
GFR calc non Af Amer: 77 mL/min/{1.73_m2} (ref >59)
Glucose: 93 mg/dL (ref 65–99)
Potassium: 3.8 mmol/L (ref 3.5–5.2)
Sodium: 141 mmol/L (ref 134–144)

## 2015-06-12 ENCOUNTER — Encounter: Payer: Self-pay | Admitting: Family Medicine

## 2015-06-12 ENCOUNTER — Ambulatory Visit (INDEPENDENT_AMBULATORY_CARE_PROVIDER_SITE_OTHER): Payer: Medicare Other | Admitting: Family Medicine

## 2015-06-12 VITALS — BP 128/74 | Ht 72.25 in | Wt 209.0 lb

## 2015-06-12 DIAGNOSIS — I10 Essential (primary) hypertension: Secondary | ICD-10-CM

## 2015-06-12 DIAGNOSIS — Z0000 Encounter for general adult medical examination without abnormal findings: Secondary | ICD-10-CM | POA: Diagnosis not present

## 2015-06-12 MED ORDER — DOXAZOSIN MESYLATE 2 MG PO TABS: 2.0000 mg | ORAL_TABLET | Freq: Every day | ORAL | Status: DC

## 2015-06-12 MED ORDER — AMLODIPINE BESYLATE 5 MG PO TABS: ORAL_TABLET | ORAL | Status: DC

## 2015-06-12 MED ORDER — BENAZEPRIL HCL 20 MG PO TABS: ORAL_TABLET | ORAL | Status: DC

## 2015-06-12 MED ORDER — INDAPAMIDE 2.5 MG PO TABS: 2.5000 mg | ORAL_TABLET | Freq: Every day | ORAL | Status: DC

## 2015-06-12 NOTE — Patient Instructions (Signed)
Results for orders placed or performed in visit on 04/25/15  Lipid panel  Result Value Ref Range   Cholesterol, Total 177 100 - 199 mg/dL   Triglycerides 53 0 - 149 mg/dL   HDL 58 >16 mg/dL   VLDL Cholesterol Cal 11 5 - 40 mg/dL   LDL Calculated 109 (H) 0 - 99 mg/dL   Chol/HDL Ratio 3.1 0.0 - 5.0 ratio units  Hepatic function panel  Result Value Ref Range   Total Protein 6.9 6.0 - 8.5 g/dL   Albumin 4.6 3.6 - 4.8 g/dL   Bilirubin Total 0.5 0.0 - 1.2 mg/dL   Bilirubin, Direct 6.04 0.00 - 0.40 mg/dL   Alkaline Phosphatase 61 39 - 117 IU/L   AST 17 0 - 40 IU/L   ALT 16 0 - 44 IU/L  Basic metabolic panel  Result Value Ref Range   Glucose 93 65 - 99 mg/dL   BUN 18 8 - 27 mg/dL   Creatinine, Ser 5.40 0.76 - 1.27 mg/dL   GFR calc non Af Amer 77 >59 mL/min/1.73   GFR calc Af Amer 89 >59 mL/min/1.73   BUN/Creatinine Ratio 18 10 - 22   Sodium 141 134 - 144 mmol/L   Potassium 3.8 3.5 - 5.2 mmol/L   Chloride 99 96 - 106 mmol/L   CO2 26 18 - 29 mmol/L   Calcium 9.5 8.6 - 10.2 mg/dL  CBC with Differential/Platelet  Result Value Ref Range   WBC 5.9 3.4 - 10.8 x10E3/uL   RBC 4.64 4.14 - 5.80 x10E6/uL   Hemoglobin 13.5 12.6 - 17.7 g/dL   Hematocrit 98.1 19.1 - 51.0 %   MCV 85 79 - 97 fL   MCH 29.1 26.6 - 33.0 pg   MCHC 34.4 31.5 - 35.7 g/dL   RDW 47.8 29.5 - 62.1 %   Platelets 289 150 - 379 x10E3/uL   Neutrophils 68 %   Lymphs 19 %   Monocytes 9 %   Eos 3 %   Basos 1 %   Neutrophils Absolute 4.0 1.4 - 7.0 x10E3/uL   Lymphocytes Absolute 1.1 0.7 - 3.1 x10E3/uL   Monocytes Absolute 0.5 0.1 - 0.9 x10E3/uL   EOS (ABSOLUTE) 0.2 0.0 - 0.4 x10E3/uL   Basophils Absolute 0.0 0.0 - 0.2 x10E3/uL   Immature Granulocytes 0 %   Immature Grans (Abs) 0.0 0.0 - 0.1 x10E3/uL  PSA  Result Value Ref Range   Prostate Specific Ag, Serum 3.3 0.0 - 4.0 ng/mL

## 2015-06-12 NOTE — Progress Notes (Signed)
Subjective:    Patient ID: Richard Becker, male    DOB: 20-Jan-1946, 70 y.o.   MRN: [id number]  HPI AWV- Annual Wellness Visit  The patient was seen for their annual wellness visit. The patient's past medical history, surgical history, and family history were reviewed. Pertinent vaccines were reviewed ( tetanus, pneumonia, shingles, flu) The patient's medication list was reviewed and updated.  The height and weight were entered. The patient's current BMI is: 28.15  Cognitive screening was completed. Outcome of Mini - Cog: pass  Falls within the past 6 months: none  Current tobacco usage: none (All patients who use tobacco were given written and verbal information on quitting)  Recent listing of emergency department/hospitalizations over the past year were reviewed.  current specialist the patient sees on a regular basis: none   Medicare annual wellness visit patient questionnaire was reviewed.  A written screening schedule for the patient for the next 5-10 years was given. Appropriate discussion of followup regarding next visit was discussed.  Sticking with b p meds faithfully   Colon last done feb 17, due again in ten yrs  Results for orders placed or performed in visit on 04/25/15  Lipid panel  Result Value Ref Range   Cholesterol, Total 177 100 - 199 mg/dL   Triglycerides 53 0 - 149 mg/dL   HDL 58 >40 mg/dL   VLDL Cholesterol Cal 11 5 - 40 mg/dL   LDL Calculated 981 (H) 0 - 99 mg/dL   Chol/HDL Ratio 3.1 0.0 - 5.0 ratio units  Hepatic function panel  Result Value Ref Range   Total Protein 6.9 6.0 - 8.5 g/dL   Albumin 4.6 3.6 - 4.8 g/dL   Bilirubin Total 0.5 0.0 - 1.2 mg/dL   Bilirubin, Direct 1.91 0.00 - 0.40 mg/dL   Alkaline Phosphatase 61 39 - 117 IU/L   AST 17 0 - 40 IU/L   ALT 16 0 - 44 IU/L  Basic metabolic panel  Result Value Ref Range   Glucose 93 65 - 99 mg/dL   BUN 18 8 - 27 mg/dL   Creatinine, Ser 4.78 0.76 - 1.27 mg/dL   GFR calc non Af Amer 77  >59 mL/min/1.73   GFR calc Af Amer 89 >59 mL/min/1.73   BUN/Creatinine Ratio 18 10 - 22   Sodium 141 134 - 144 mmol/L   Potassium 3.8 3.5 - 5.2 mmol/L   Chloride 99 96 - 106 mmol/L   CO2 26 18 - 29 mmol/L   Calcium 9.5 8.6 - 10.2 mg/dL  CBC with Differential/Platelet  Result Value Ref Range   WBC 5.9 3.4 - 10.8 x10E3/uL   RBC 4.64 4.14 - 5.80 x10E6/uL   Hemoglobin 13.5 12.6 - 17.7 g/dL   Hematocrit 29.5 62.1 - 51.0 %   MCV 85 79 - 97 fL   MCH 29.1 26.6 - 33.0 pg   MCHC 34.4 31.5 - 35.7 g/dL   RDW 30.8 65.7 - 84.6 %   Platelets 289 150 - 379 x10E3/uL   Neutrophils 68 %   Lymphs 19 %   Monocytes 9 %   Eos 3 %   Basos 1 %   Neutrophils Absolute 4.0 1.4 - 7.0 x10E3/uL   Lymphocytes Absolute 1.1 0.7 - 3.1 x10E3/uL   Monocytes Absolute 0.5 0.1 - 0.9 x10E3/uL   EOS (ABSOLUTE) 0.2 0.0 - 0.4 x10E3/uL   Basophils Absolute 0.0 0.0 - 0.2 x10E3/uL   Immature Granulocytes 0 %   Immature Grans (Abs) 0.0 0.0 -  0.1 x10E3/uL  PSA  Result Value Ref Range   Prostate Specific Ag, Serum 3.3 0.0 - 4.0 ng/mL     Patient is compliant with his blood pressure medication. Generally does not miss a dose. Overall watching salt intake. Medications are reviewed today. Blood pressures are reviewed from home monitoring and numbers overall are very good. On multiple medicines for his blood pressure    Review of Systems  Constitutional: Negative for fever, activity change and appetite change.  HENT: Negative for congestion and rhinorrhea.   Eyes: Negative for discharge.  Respiratory: Negative for cough and wheezing.   Cardiovascular: Negative for chest pain.  Gastrointestinal: Negative for vomiting, abdominal pain and blood in stool.  Genitourinary: Negative for frequency and difficulty urinating.  Musculoskeletal: Negative for neck pain.  Skin: Negative for rash.  Allergic/Immunologic: Negative for environmental allergies and food allergies.  Neurological: Negative for weakness and headaches.    Psychiatric/Behavioral: Negative for agitation.  All other systems reviewed and are negative.      Objective:   Physical Exam  Constitutional: He appears well-developed and well-nourished.  HENT:  Head: Normocephalic and atraumatic.  Right Ear: External ear normal.  Left Ear: External ear normal.  Nose: Nose normal.  Mouth/Throat: Oropharynx is clear and moist.  Eyes: EOM are normal. Pupils are equal, round, and reactive to light.  Neck: Normal range of motion. Neck supple. No thyromegaly present.  Cardiovascular: Normal rate, regular rhythm and normal heart sounds.   No murmur heard. Pulmonary/Chest: Effort normal and breath sounds normal. No respiratory distress. He has no wheezes.  Abdominal: Soft. Bowel sounds are normal. He exhibits no distension and no mass. There is no tenderness.  Genitourinary: Penis normal.  Musculoskeletal: Normal range of motion. He exhibits no edema.  Lymphadenopathy:    He has no cervical adenopathy.  Neurological: He is alert. He exhibits normal muscle tone.  Skin: Skin is warm and dry. No erythema.  Psychiatric: He has a normal mood and affect. His behavior is normal. Judgment normal.  Vitals reviewed.  Blood pressure 126/78 both arms       Assessment & Plan:  Impression 1 wellness exam up-to-date on colonoscopy. Up-to-date on immunizations. #2 hypertension good control discussed no obvious side effects with meds meds reviewed to maintain same plan medications were refilled. Diet exercise discussed. Blood work reviewed. Follow-up in 6 months WSL

## 2015-07-03 DIAGNOSIS — H25011 Cortical age-related cataract, right eye: Secondary | ICD-10-CM | POA: Diagnosis not present

## 2015-07-03 DIAGNOSIS — H5203 Hypermetropia, bilateral: Secondary | ICD-10-CM | POA: Diagnosis not present

## 2015-07-03 DIAGNOSIS — H52223 Regular astigmatism, bilateral: Secondary | ICD-10-CM | POA: Diagnosis not present

## 2015-07-03 DIAGNOSIS — H524 Presbyopia: Secondary | ICD-10-CM | POA: Diagnosis not present

## 2015-12-10 ENCOUNTER — Ambulatory Visit: Payer: Medicare Other | Admitting: Family Medicine

## 2015-12-17 ENCOUNTER — Encounter: Payer: Self-pay | Admitting: Family Medicine

## 2015-12-17 ENCOUNTER — Ambulatory Visit (INDEPENDENT_AMBULATORY_CARE_PROVIDER_SITE_OTHER): Payer: Medicare Other | Admitting: Family Medicine

## 2015-12-17 VITALS — BP 122/82 | Ht 72.0 in | Wt 211.8 lb

## 2015-12-17 DIAGNOSIS — I10 Essential (primary) hypertension: Secondary | ICD-10-CM | POA: Diagnosis not present

## 2015-12-17 DIAGNOSIS — N400 Enlarged prostate without lower urinary tract symptoms: Secondary | ICD-10-CM

## 2015-12-17 MED ORDER — DOXAZOSIN MESYLATE 2 MG PO TABS: 2.0000 mg | ORAL_TABLET | Freq: Every day | ORAL | 5 refills | Status: DC

## 2015-12-17 MED ORDER — BENAZEPRIL HCL 20 MG PO TABS: ORAL_TABLET | ORAL | 5 refills | Status: DC

## 2015-12-17 MED ORDER — INDAPAMIDE 2.5 MG PO TABS: 2.5000 mg | ORAL_TABLET | Freq: Every day | ORAL | 5 refills | Status: DC

## 2015-12-17 MED ORDER — AMLODIPINE BESYLATE 5 MG PO TABS: ORAL_TABLET | ORAL | 5 refills | Status: DC

## 2015-12-17 NOTE — Progress Notes (Signed)
   Subjective:    Patient ID: Richard Becker, male    DOB: 01-20-1946, 70 y.o.   MRN: [id number]  Hypertension  This is a chronic problem. The current episode started more than 1 year ago. Risk factors for coronary artery disease include male gender. Treatments tried: norvasc, lotensin, cardura,lozol. There are no compliance problems.    Three to four d per wk exercise  Trying to wach dit  Blood pressure medicine and blood pressure levels reviewed today with patient. Compliant with blood pressure medicine. States does not miss a dose. No obvious side effects. Blood pressure generally good when checked elsewhere. Watching salt intake.    No problems or concerns  Review of Systems No headache, no major weight loss or weight gain, no chest pain no back pain abdominal pain no change in bowel habits complete ROS otherwise negative     Objective:   Physical Exam  Alert vitals stable, NAD. Blood pressure good on repeat. HEENT normal. Lungs clear. Heart regular rate and rhythm.       Assessment & Plan:  .diagmeds 1. Essential hypertension Compliant with blood pressure medication. No obvious side effects blood pressure good when checked elsewhere  2. Prostate hypertrophy Cardura definitely helping. No adverse side effects

## 2016-01-22 ENCOUNTER — Other Ambulatory Visit: Payer: Self-pay

## 2016-02-06 ENCOUNTER — Telehealth: Payer: Self-pay | Admitting: Family Medicine

## 2016-02-06 NOTE — Telephone Encounter (Signed)
Actually this is for pt Richard Becker, her husband is charlie, different person, form signed

## 2016-02-06 NOTE — Telephone Encounter (Signed)
In doctor's box

## 2016-02-06 NOTE — Telephone Encounter (Signed)
Pt dropped off a form to be filled out for [employer]. Pt would like this to be mailed to them when finished. Form in nurse box.

## 2016-03-13 DIAGNOSIS — Z23 Encounter for immunization: Secondary | ICD-10-CM | POA: Diagnosis not present

## 2016-05-15 ENCOUNTER — Other Ambulatory Visit: Payer: Self-pay | Admitting: Family Medicine

## 2016-06-18 ENCOUNTER — Encounter: Payer: Medicare Other | Admitting: Family Medicine

## 2016-06-19 ENCOUNTER — Telehealth: Payer: Self-pay | Admitting: Family Medicine

## 2016-06-19 DIAGNOSIS — Z0000 Encounter for general adult medical examination without abnormal findings: Secondary | ICD-10-CM

## 2016-06-19 DIAGNOSIS — Z79899 Other long term (current) drug therapy: Secondary | ICD-10-CM

## 2016-06-19 DIAGNOSIS — E785 Hyperlipidemia, unspecified: Secondary | ICD-10-CM

## 2016-06-19 DIAGNOSIS — I10 Essential (primary) hypertension: Secondary | ICD-10-CM

## 2016-06-19 DIAGNOSIS — Z125 Encounter for screening for malignant neoplasm of prostate: Secondary | ICD-10-CM

## 2016-06-19 NOTE — Telephone Encounter (Signed)
Met 7 lip liv psa 

## 2016-06-19 NOTE — Telephone Encounter (Signed)
Pt needs lab orders, has upcoming PE appt here 07/01/16  [facility] call [facility]

## 2016-06-20 NOTE — Telephone Encounter (Signed)
Orders ready. Pt notified.  

## 2016-06-24 DIAGNOSIS — Z79899 Other long term (current) drug therapy: Secondary | ICD-10-CM | POA: Diagnosis not present

## 2016-06-24 DIAGNOSIS — E785 Hyperlipidemia, unspecified: Secondary | ICD-10-CM | POA: Diagnosis not present

## 2016-06-24 DIAGNOSIS — I10 Essential (primary) hypertension: Secondary | ICD-10-CM | POA: Diagnosis not present

## 2016-06-24 DIAGNOSIS — Z125 Encounter for screening for malignant neoplasm of prostate: Secondary | ICD-10-CM | POA: Diagnosis not present

## 2016-06-25 LAB — BASIC METABOLIC PANEL
BUN/Creatinine Ratio: 20 (ref 10–24)
BUN: 20 mg/dL (ref 8–27)
CO2: 26 mmol/L (ref 18–29)
Calcium: 9.7 mg/dL (ref 8.6–10.2)
Chloride: 99 mmol/L (ref 96–106)
Creatinine, Ser: 0.98 mg/dL (ref 0.76–1.27)
GFR calc Af Amer: 91 mL/min/{1.73_m2} (ref >59)
GFR calc non Af Amer: 78 mL/min/{1.73_m2} (ref >59)
Glucose: 102 mg/dL — ABNORMAL HIGH (ref 65–99)
Potassium: 4 mmol/L (ref 3.5–5.2)
Sodium: 140 mmol/L (ref 134–144)

## 2016-06-25 LAB — LIPID PANEL
Chol/HDL Ratio: 3.8 ratio units (ref 0.0–5.0)
Cholesterol, Total: 188 mg/dL (ref 100–199)
HDL: 49 mg/dL (ref >39)
LDL Calculated: 121 mg/dL — ABNORMAL HIGH (ref 0–99)
Triglycerides: 88 mg/dL (ref 0–149)
VLDL Cholesterol Cal: 18 mg/dL (ref 5–40)

## 2016-06-25 LAB — HEPATIC FUNCTION PANEL
ALT: 16 IU/L (ref 0–44)
AST: 13 IU/L (ref 0–40)
Albumin: 4.5 g/dL (ref 3.6–4.8)
Alkaline Phosphatase: 52 IU/L (ref 39–117)
Bilirubin Total: 0.6 mg/dL (ref 0.0–1.2)
Bilirubin, Direct: 0.14 mg/dL (ref 0.00–0.40)
Total Protein: 6.9 g/dL (ref 6.0–8.5)

## 2016-06-25 LAB — PSA: Prostate Specific Ag, Serum: 3.9 ng/mL (ref 0.0–4.0)

## 2016-07-01 ENCOUNTER — Ambulatory Visit (INDEPENDENT_AMBULATORY_CARE_PROVIDER_SITE_OTHER): Payer: Medicare Other | Admitting: Family Medicine

## 2016-07-01 ENCOUNTER — Encounter: Payer: Self-pay | Admitting: Family Medicine

## 2016-07-01 VITALS — BP 124/84 | Ht 72.0 in | Wt 211.4 lb

## 2016-07-01 DIAGNOSIS — I10 Essential (primary) hypertension: Secondary | ICD-10-CM | POA: Diagnosis not present

## 2016-07-01 DIAGNOSIS — M720 Palmar fascial fibromatosis [Dupuytren]: Secondary | ICD-10-CM | POA: Diagnosis not present

## 2016-07-01 DIAGNOSIS — Z0000 Encounter for general adult medical examination without abnormal findings: Secondary | ICD-10-CM

## 2016-07-01 MED ORDER — INDAPAMIDE 2.5 MG PO TABS: 2.5000 mg | ORAL_TABLET | Freq: Every day | ORAL | 5 refills | Status: DC

## 2016-07-01 MED ORDER — AMLODIPINE BESYLATE 5 MG PO TABS: ORAL_TABLET | ORAL | 5 refills | Status: DC

## 2016-07-01 MED ORDER — DOXAZOSIN MESYLATE 2 MG PO TABS
2.0000 mg | ORAL_TABLET | Freq: Every day | ORAL | 5 refills | Status: DC
Start: 2016-07-01 — End: 2016-12-29

## 2016-07-01 MED ORDER — BENAZEPRIL HCL 20 MG PO TABS: ORAL_TABLET | ORAL | 5 refills | Status: DC

## 2016-07-01 NOTE — Progress Notes (Signed)
Subjective:    Patient ID: Richard Becker, male    DOB: 10-06-1945, 71 y.o.   MRN: [id number]  HPI  AWV- Annual Wellness Visit  The patient was seen for their annual wellness visit. The patient's past medical history, surgical history, and family history were reviewed. Pertinent vaccines were reviewed ( tetanus, pneumonia, shingles, flu) The patient's medication list was reviewed and updated.  The height and weight were entered. The patient's current BMI is:28.7  Cognitive screening was completed. Outcome of Mini - Cog: pass  Falls within the past 6 months:none  Current tobacco usage: none (All patients who use tobacco were given written and verbal information on quitting)  Recent listing of emergency department/hospitalizations over the past year were reviewed.  current specialist the patient sees on a regular basis: none   Medicare annual wellness visit patient questionnaire was reviewed.  A written screening schedule for the patient for the next 5-10 years was given. Appropriate discussion of followup regarding next visit was discussed.  Patient also for blood pressure follow up and discuss recent labs  Results for orders placed or performed in visit on 06/19/16  Lipid panel  Result Value Ref Range   Cholesterol, Total 188 100 - 199 mg/dL   Triglycerides 88 0 - 149 mg/dL   HDL 49 >40 mg/dL   VLDL Cholesterol Cal 18 5 - 40 mg/dL   LDL Calculated 981 (H) 0 - 99 mg/dL   Chol/HDL Ratio 3.8 0.0 - 5.0 ratio units  Hepatic function panel  Result Value Ref Range   Total Protein 6.9 6.0 - 8.5 g/dL   Albumin 4.5 3.6 - 4.8 g/dL   Bilirubin Total 0.6 0.0 - 1.2 mg/dL   Bilirubin, Direct 1.91 0.00 - 0.40 mg/dL   Alkaline Phosphatase 52 39 - 117 IU/L   AST 13 0 - 40 IU/L   ALT 16 0 - 44 IU/L  PSA  Result Value Ref Range   Prostate Specific Ag, Serum 3.9 0.0 - 4.0 ng/mL  Basic metabolic panel  Result Value Ref Range   Glucose 102 (H) 65 - 99 mg/dL   BUN 20 8 - 27 mg/dL    Creatinine, Ser 4.78 0.76 - 1.27 mg/dL   GFR calc non Af Amer 78 >59 mL/min/1.73   GFR calc Af Amer 91 >59 mL/min/1.73   BUN/Creatinine Ratio 20 10 - 24   Sodium 140 134 - 144 mmol/L   Potassium 4.0 3.5 - 5.2 mmol/L   Chloride 99 96 - 106 mmol/L   CO2 26 18 - 29 mmol/L   Calcium 9.7 8.6 - 10.2 mg/dL    2956 colon neg  And now due in ten yrs   Review of Systems  Constitutional: Negative for activity change, appetite change and fever.  HENT: Negative for congestion and rhinorrhea.   Eyes: Negative for discharge.  Respiratory: Negative for cough and wheezing.   Cardiovascular: Negative for chest pain.  Gastrointestinal: Negative for abdominal pain, blood in stool and vomiting.  Genitourinary: Negative for difficulty urinating and frequency.  Musculoskeletal: Negative for neck pain.  Skin: Negative for rash.  Allergic/Immunologic: Negative for environmental allergies and food allergies.  Neurological: Negative for weakness and headaches.  Psychiatric/Behavioral: Negative for agitation.  All other systems reviewed and are negative.      Objective:   Physical Exam  Constitutional: He appears well-developed and well-nourished.  HENT:  Head: Normocephalic and atraumatic.  Right Ear: External ear normal.  Left Ear: External ear normal.  Nose: Nose  normal.  Mouth/Throat: Oropharynx is clear and moist.  Eyes: EOM are normal. Pupils are equal, round, and reactive to light.  Neck: Normal range of motion. Neck supple. No thyromegaly present.  Cardiovascular: Normal rate, regular rhythm and normal heart sounds.   No murmur heard. Pulmonary/Chest: Effort normal and breath sounds normal. No respiratory distress. He has no wheezes.  Abdominal: Soft. Bowel sounds are normal. He exhibits no distension and no mass. There is no tenderness.  Genitourinary: Penis normal.  Musculoskeletal: Normal range of motion. He exhibits no edema.  Left hand significant deep protuberance contracture  evident  Lymphadenopathy:    He has no cervical adenopathy.  Neurological: He is alert. He exhibits normal muscle tone.  Skin: Skin is warm and dry. No erythema.  Psychiatric: He has a normal mood and affect. His behavior is normal. Judgment normal.  Vitals reviewed.         Assessment & Plan:  Impression 1 wellness exam up-to-date on colonoscopy #2 prostate hypertrophy discussed stable PSA still within normal limits #3 impaired fasting glucose stable #4 hypertension good control discussed maintain same level plan diet exercise discussed. Medications refilled. Check in 6 months. Preventive measures discussed up-to-date on immunizations patient has history of reflex sympathetic dystrophy after contracture intervention so wishes to hold off for now

## 2016-07-01 NOTE — Patient Instructions (Signed)
Results for orders placed or performed in visit on 06/19/16  Lipid panel  Result Value Ref Range   Cholesterol, Total 188 100 - 199 mg/dL   Triglycerides 88 0 - 149 mg/dL   HDL 49 >16>39 mg/dL   VLDL Cholesterol Cal 18 5 - 40 mg/dL   LDL Calculated 109121 (H) 0 - 99 mg/dL   Chol/HDL Ratio 3.8 0.0 - 5.0 ratio units  Hepatic function panel  Result Value Ref Range   Total Protein 6.9 6.0 - 8.5 g/dL   Albumin 4.5 3.6 - 4.8 g/dL   Bilirubin Total 0.6 0.0 - 1.2 mg/dL   Bilirubin, Direct 6.040.14 0.00 - 0.40 mg/dL   Alkaline Phosphatase 52 39 - 117 IU/L   AST 13 0 - 40 IU/L   ALT 16 0 - 44 IU/L  PSA  Result Value Ref Range   Prostate Specific Ag, Serum 3.9 0.0 - 4.0 ng/mL  Basic metabolic panel  Result Value Ref Range   Glucose 102 (H) 65 - 99 mg/dL   BUN 20 8 - 27 mg/dL   Creatinine, Ser 5.400.98 0.76 - 1.27 mg/dL   GFR calc non Af Amer 78 >59 mL/min/1.73   GFR calc Af Amer 91 >59 mL/min/1.73   BUN/Creatinine Ratio 20 10 - 24   Sodium 140 134 - 144 mmol/L   Potassium 4.0 3.5 - 5.2 mmol/L   Chloride 99 96 - 106 mmol/L   CO2 26 18 - 29 mmol/L   Calcium 9.7 8.6 - 10.2 mg/dL

## 2016-12-29 ENCOUNTER — Ambulatory Visit (INDEPENDENT_AMBULATORY_CARE_PROVIDER_SITE_OTHER): Payer: Medicare Other | Admitting: Family Medicine

## 2016-12-29 ENCOUNTER — Encounter: Payer: Self-pay | Admitting: Family Medicine

## 2016-12-29 VITALS — BP 118/72 | Ht 72.0 in | Wt 212.1 lb

## 2016-12-29 DIAGNOSIS — I10 Essential (primary) hypertension: Secondary | ICD-10-CM | POA: Diagnosis not present

## 2016-12-29 MED ORDER — BENAZEPRIL HCL 20 MG PO TABS: ORAL_TABLET | ORAL | 5 refills | Status: DC

## 2016-12-29 MED ORDER — INDAPAMIDE 2.5 MG PO TABS: 2.5000 mg | ORAL_TABLET | Freq: Every day | ORAL | 5 refills | Status: DC

## 2016-12-29 MED ORDER — AMLODIPINE BESYLATE 5 MG PO TABS: ORAL_TABLET | ORAL | 5 refills | Status: DC

## 2016-12-29 MED ORDER — DOXAZOSIN MESYLATE 2 MG PO TABS: 2.0000 mg | ORAL_TABLET | Freq: Every day | ORAL | 5 refills | Status: DC

## 2016-12-29 NOTE — Progress Notes (Signed)
   Subjective:    Patient ID: Richard Becker, male    DOB: [birth year], 71 y.o.   MRN: [id number]  Hypertension  This is a chronic problem. The current episode started more than 1 month ago. The problem has been gradually improving since onset. The problem is controlled.  States he eats healthy and exercises.  Blood pressure medicine and blood pressure levels reviewed today with patient. Compliant with blood pressure medicine. States does not miss a dose. No obvious side effects. Blood pressure generally good when checked elsewhere. Watching salt intake.  Waking a lot,  watchig diet closely   No se's fr meds  No other concerns   Review of Systems No headache, no major weight loss or weight gain, no chest pain no back pain abdominal pain no change in bowel habits complete ROS otherwise negative     Objective:   Physical Exam Alert vitals stable, NAD. Blood pressure good on repeat. HEENT normal. Lungs clear. Heart regular rate and rhythm.        Assessment & Plan:  Impression 1 hypertension good control discussed. Continue same medications. Importance of compliance with meds discussed. Diet and exercise discussed. Follow-up in 6 months for wellness exam plus chronic visit

## 2017-01-15 DIAGNOSIS — H11153 Pinguecula, bilateral: Secondary | ICD-10-CM | POA: Diagnosis not present

## 2017-01-15 DIAGNOSIS — H25013 Cortical age-related cataract, bilateral: Secondary | ICD-10-CM | POA: Diagnosis not present

## 2017-04-03 DIAGNOSIS — Z23 Encounter for immunization: Secondary | ICD-10-CM | POA: Diagnosis not present

## 2017-04-30 ENCOUNTER — Other Ambulatory Visit: Payer: Self-pay

## 2017-06-16 ENCOUNTER — Other Ambulatory Visit: Payer: Self-pay | Admitting: Family Medicine

## 2017-06-22 ENCOUNTER — Telehealth: Payer: Self-pay | Admitting: Family Medicine

## 2017-06-22 DIAGNOSIS — I10 Essential (primary) hypertension: Secondary | ICD-10-CM

## 2017-06-22 DIAGNOSIS — Z125 Encounter for screening for malignant neoplasm of prostate: Secondary | ICD-10-CM

## 2017-06-22 DIAGNOSIS — E785 Hyperlipidemia, unspecified: Secondary | ICD-10-CM

## 2017-06-22 DIAGNOSIS — Z79899 Other long term (current) drug therapy: Secondary | ICD-10-CM

## 2017-06-22 NOTE — Telephone Encounter (Signed)
Patient has appt for a physical next week with Dr. Brett CanalesSteve and would like the orders put in for labwork at [facility].

## 2017-06-22 NOTE — Telephone Encounter (Signed)
Last labs 05/2016: Lipid, Liver, Met 7 and PSA

## 2017-06-22 NOTE — Telephone Encounter (Signed)
Rep same 

## 2017-06-22 NOTE — Telephone Encounter (Signed)
Blood work ordered in Epic. Patient notified. 

## 2017-06-25 DIAGNOSIS — Z125 Encounter for screening for malignant neoplasm of prostate: Secondary | ICD-10-CM | POA: Diagnosis not present

## 2017-06-25 DIAGNOSIS — Z79899 Other long term (current) drug therapy: Secondary | ICD-10-CM | POA: Diagnosis not present

## 2017-06-25 DIAGNOSIS — I10 Essential (primary) hypertension: Secondary | ICD-10-CM | POA: Diagnosis not present

## 2017-06-25 DIAGNOSIS — E785 Hyperlipidemia, unspecified: Secondary | ICD-10-CM | POA: Diagnosis not present

## 2017-06-26 LAB — HEPATIC FUNCTION PANEL
ALT: 17 IU/L (ref 0–44)
AST: 11 IU/L (ref 0–40)
Albumin: 4.6 g/dL (ref 3.5–4.8)
Alkaline Phosphatase: 63 IU/L (ref 39–117)
Bilirubin Total: 0.7 mg/dL (ref 0.0–1.2)
Bilirubin, Direct: 0.21 mg/dL (ref 0.00–0.40)
Total Protein: 7 g/dL (ref 6.0–8.5)

## 2017-06-26 LAB — BASIC METABOLIC PANEL
BUN/Creatinine Ratio: 16 (ref 10–24)
BUN: 17 mg/dL (ref 8–27)
CO2: 26 mmol/L (ref 20–29)
Calcium: 9.7 mg/dL (ref 8.6–10.2)
Chloride: 100 mmol/L (ref 96–106)
Creatinine, Ser: 1.06 mg/dL (ref 0.76–1.27)
GFR calc Af Amer: 82 mL/min/{1.73_m2} (ref >59)
GFR calc non Af Amer: 71 mL/min/{1.73_m2} (ref >59)
Glucose: 108 mg/dL — ABNORMAL HIGH (ref 65–99)
Potassium: 4 mmol/L (ref 3.5–5.2)
Sodium: 142 mmol/L (ref 134–144)

## 2017-06-26 LAB — PSA: Prostate Specific Ag, Serum: 3.9 ng/mL (ref 0.0–4.0)

## 2017-06-26 LAB — LIPID PANEL
Chol/HDL Ratio: 3.3 ratio (ref 0.0–5.0)
Cholesterol, Total: 174 mg/dL (ref 100–199)
HDL: 53 mg/dL (ref >39)
LDL Calculated: 106 mg/dL — ABNORMAL HIGH (ref 0–99)
Triglycerides: 74 mg/dL (ref 0–149)
VLDL Cholesterol Cal: 15 mg/dL (ref 5–40)

## 2017-07-02 ENCOUNTER — Encounter: Payer: Self-pay | Admitting: Family Medicine

## 2017-07-02 ENCOUNTER — Ambulatory Visit (INDEPENDENT_AMBULATORY_CARE_PROVIDER_SITE_OTHER): Payer: Medicare Other | Admitting: Family Medicine

## 2017-07-02 VITALS — BP 128/78 | Ht 72.0 in | Wt 213.6 lb

## 2017-07-02 DIAGNOSIS — N400 Enlarged prostate without lower urinary tract symptoms: Secondary | ICD-10-CM | POA: Diagnosis not present

## 2017-07-02 DIAGNOSIS — I10 Essential (primary) hypertension: Secondary | ICD-10-CM

## 2017-07-02 DIAGNOSIS — R7301 Impaired fasting glucose: Secondary | ICD-10-CM

## 2017-07-02 DIAGNOSIS — Z0000 Encounter for general adult medical examination without abnormal findings: Secondary | ICD-10-CM

## 2017-07-02 DIAGNOSIS — M720 Palmar fascial fibromatosis [Dupuytren]: Secondary | ICD-10-CM | POA: Diagnosis not present

## 2017-07-02 DIAGNOSIS — Z0001 Encounter for general adult medical examination with abnormal findings: Secondary | ICD-10-CM

## 2017-07-02 NOTE — Progress Notes (Signed)
Subjective:    Patient ID: Richard Becker, male    DOB: Jan 26, 1946, 72 y.o.   MRN: [id number]  HPI The patient comes in today for a wellness visit.    A review of their health history was completed.  A review of medications was also completed.  Any needed refills; yes  Eating habits: health conscious   Falls/  MVA accidents in past few months: none  Regular exercise: yes  Specialist pt sees on regular basis: none  Preventative health issues were discussed.   Additional concerns: none  Exercising hit or miss last few mo, but mostly three to four times per wk  Results for orders placed or performed in visit on 06/22/17  Lipid panel  Result Value Ref Range   Cholesterol, Total 174 100 - 199 mg/dL   Triglycerides 74 0 - 149 mg/dL   HDL 53 >40>39 mg/dL   VLDL Cholesterol Cal 15 5 - 40 mg/dL   LDL Calculated 981106 (H) 0 - 99 mg/dL   Chol/HDL Ratio 3.3 0.0 - 5.0 ratio  Hepatic function panel  Result Value Ref Range   Total Protein 7.0 6.0 - 8.5 g/dL   Albumin 4.6 3.5 - 4.8 g/dL   Bilirubin Total 0.7 0.0 - 1.2 mg/dL   Bilirubin, Direct 1.910.21 0.00 - 0.40 mg/dL   Alkaline Phosphatase 63 39 - 117 IU/L   AST 11 0 - 40 IU/L   ALT 17 0 - 44 IU/L  Basic metabolic panel  Result Value Ref Range   Glucose 108 (H) 65 - 99 mg/dL   BUN 17 8 - 27 mg/dL   Creatinine, Ser 4.781.06 0.76 - 1.27 mg/dL   GFR calc non Af Amer 71 >59 mL/min/1.73   GFR calc Af Amer 82 >59 mL/min/1.73   BUN/Creatinine Ratio 16 10 - 24   Sodium 142 134 - 144 mmol/L   Potassium 4.0 3.5 - 5.2 mmol/L   Chloride 100 96 - 106 mmol/L   CO2 26 20 - 29 mmol/L   Calcium 9.7 8.6 - 10.2 mg/dL  PSA  Result Value Ref Range   Prostate Specific Ag, Serum 3.9 0.0 - 4.0 ng/mL   fam hx sister has diabetes, doris   Blood pressure medicine and blood pressure levels reviewed today with patient. Compliant with blood pressure medicine. States does not miss a dose. No obvious side effects. Blood pressure generally good when  checked elsewhere. Watching salt intake.      Review of Systems  Constitutional: Negative for activity change, appetite change and fever.  HENT: Negative for congestion and rhinorrhea.   Eyes: Negative for discharge.  Respiratory: Negative for cough and wheezing.   Cardiovascular: Negative for chest pain.  Gastrointestinal: Negative for abdominal pain, blood in stool and vomiting.  Genitourinary: Negative for difficulty urinating and frequency.  Musculoskeletal: Negative for neck pain.  Skin: Negative for rash.  Allergic/Immunologic: Negative for environmental allergies and food allergies.  Neurological: Negative for weakness and headaches.  Psychiatric/Behavioral: Negative for agitation.  All other systems reviewed and are negative.      Objective:   Physical Exam  Constitutional: He appears well-developed and well-nourished.  HENT:  Head: Normocephalic and atraumatic.  Right Ear: External ear normal.  Left Ear: External ear normal.  Nose: Nose normal.  Mouth/Throat: Oropharynx is clear and moist.  Eyes: Right eye exhibits no discharge. Left eye exhibits no discharge. No scleral icterus.  Neck: Normal range of motion. Neck supple. No thyromegaly present.  Cardiovascular: Normal rate,  regular rhythm and normal heart sounds.  No murmur heard. Pulmonary/Chest: Effort normal and breath sounds normal. No respiratory distress. He has no wheezes.  Abdominal: Soft. Bowel sounds are normal. He exhibits no distension and no mass. There is no tenderness.  Genitourinary: Penis normal.  Genitourinary Comments: Diffuse mild enlargement of the prostate  Musculoskeletal: Normal range of motion. He exhibits no edema.  Left hand contracture present  Lymphadenopathy:    He has no cervical adenopathy.  Neurological: He is alert. He exhibits normal muscle tone. Coordination normal.  Skin: Skin is warm and dry. No erythema.  Psychiatric: He has a normal mood and affect. His behavior is  normal. Judgment normal.  Vitals reviewed.  Blood pressure good on repeat       Assessment & Plan:  2016 colon next one due in 26  Wellness exam.  Diet discussed.  Exercise discussed.  Vaccines discussed.  Up-to-date on colonoscopy  2.  Hypertension good control discussed blood pressures from home reviewed.  Compliance discussed maintain same dose  3.  Prediabetes discussed  4.  Prostate hypertrophy not enough to warrant medications up once per night on average discussed.  PSA stable at 3.9  Medications refilled diet exercise discussed follow-up in 6 months for chronic

## 2017-07-02 NOTE — Patient Instructions (Signed)
Results for orders placed or performed in visit on 06/22/17  Lipid panel  Result Value Ref Range   Cholesterol, Total 174 100 - 199 mg/dL   Triglycerides 74 0 - 149 mg/dL   HDL 53 >13>39 mg/dL   VLDL Cholesterol Cal 15 5 - 40 mg/dL   LDL Calculated 086106 (H) 0 - 99 mg/dL   Chol/HDL Ratio 3.3 0.0 - 5.0 ratio  Hepatic function panel  Result Value Ref Range   Total Protein 7.0 6.0 - 8.5 g/dL   Albumin 4.6 3.5 - 4.8 g/dL   Bilirubin Total 0.7 0.0 - 1.2 mg/dL   Bilirubin, Direct 5.780.21 0.00 - 0.40 mg/dL   Alkaline Phosphatase 63 39 - 117 IU/L   AST 11 0 - 40 IU/L   ALT 17 0 - 44 IU/L  Basic metabolic panel  Result Value Ref Range   Glucose 108 (H) 65 - 99 mg/dL   BUN 17 8 - 27 mg/dL   Creatinine, Ser 4.691.06 0.76 - 1.27 mg/dL   GFR calc non Af Amer 71 >59 mL/min/1.73   GFR calc Af Amer 82 >59 mL/min/1.73   BUN/Creatinine Ratio 16 10 - 24   Sodium 142 134 - 144 mmol/L   Potassium 4.0 3.5 - 5.2 mmol/L   Chloride 100 96 - 106 mmol/L   CO2 26 20 - 29 mmol/L   Calcium 9.7 8.6 - 10.2 mg/dL  PSA  Result Value Ref Range   Prostate Specific Ag, Serum 3.9 0.0 - 4.0 ng/mL

## 2017-12-29 ENCOUNTER — Encounter: Payer: Self-pay | Admitting: Family Medicine

## 2017-12-29 ENCOUNTER — Ambulatory Visit (INDEPENDENT_AMBULATORY_CARE_PROVIDER_SITE_OTHER): Payer: Medicare Other | Admitting: Family Medicine

## 2017-12-29 VITALS — BP 120/80 | Ht 72.0 in | Wt 207.0 lb

## 2017-12-29 DIAGNOSIS — E785 Hyperlipidemia, unspecified: Secondary | ICD-10-CM

## 2017-12-29 DIAGNOSIS — I10 Essential (primary) hypertension: Secondary | ICD-10-CM | POA: Diagnosis not present

## 2017-12-29 DIAGNOSIS — M720 Palmar fascial fibromatosis [Dupuytren]: Secondary | ICD-10-CM

## 2017-12-29 DIAGNOSIS — N400 Enlarged prostate without lower urinary tract symptoms: Secondary | ICD-10-CM

## 2017-12-29 MED ORDER — DOXAZOSIN MESYLATE 2 MG PO TABS: 2.0000 mg | ORAL_TABLET | Freq: Every day | ORAL | 5 refills | Status: DC

## 2017-12-29 MED ORDER — KETOCONAZOLE 2 % EX CREA: TOPICAL_CREAM | CUTANEOUS | 3 refills | Status: DC

## 2017-12-29 MED ORDER — BENAZEPRIL HCL 20 MG PO TABS: ORAL_TABLET | ORAL | 5 refills | Status: DC

## 2017-12-29 MED ORDER — AMLODIPINE BESYLATE 5 MG PO TABS: ORAL_TABLET | ORAL | 5 refills | Status: DC

## 2017-12-29 MED ORDER — INDAPAMIDE 2.5 MG PO TABS: 2.5000 mg | ORAL_TABLET | Freq: Every day | ORAL | 5 refills | Status: DC

## 2017-12-29 NOTE — Progress Notes (Signed)
   Subjective:    Patient ID: Richard Becker, male    DOB: 07-Oct-1945, 72 y.o.   MRN: [id number]  HPI  Patient is here today to follow up on his chronic health issues. He eats healthy and exercises. He does not see any specialist.He states he has a rash in rectal area.  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  Still getting up some at nighttime to urinate.  Compliant with medications.   Blood pressure medicine and blood pressure levels reviewed today with patient. Compliant with blood pressure medicine. States does not miss a dose. No obvious side effects. Blood pressure generally good when checked elsewhere. Watching salt intake.   Exercise has slacked off a bit    BPs all good  Overall good diet     Review of Systems No headache, no major weight loss or weight gain, no chest pain no back pain abdominal pain no change in bowel habits complete ROS otherwise negative     Objective:   Physical Exam  Alert and oriented, vitals reviewed and stable, NAD ENT-TM's and ext canals WNL bilat via otoscopic exam Soft palate, tonsils and post pharynx WNL via oropharyngeal exam Neck-symmetric, no masses; thyroid nonpalpable and nontender Pulmonary-no tachypnea or accessory muscle use; Clear without wheezes via auscultation Card--no abnrml murmurs, rhythm reg and rate WNL Carotid pulses symmetric, without bruits Perianal rash intertrigo in appearance erythematous no excoriations no ulceration      Assessment & Plan:  Impression 1 hypertension.  Good control.  All numbers reviewed.  To maintain's  2.  Prostate hypertrophy.  Ongoing challenges with nocturnal urination Cardura which should help  3.  dupyrtyrens contracture left  discussed.  Patient to maintain current  4.  Intertrigo rash.  Discussed.  He was affected area.  Chronic meds refilled follow-up in 6 months diet exercise discussed

## 2018-03-05 DIAGNOSIS — Z23 Encounter for immunization: Secondary | ICD-10-CM | POA: Diagnosis not present

## 2018-04-08 ENCOUNTER — Ambulatory Visit (INDEPENDENT_AMBULATORY_CARE_PROVIDER_SITE_OTHER): Payer: Medicare Other | Admitting: Family Medicine

## 2018-04-08 ENCOUNTER — Encounter: Payer: Self-pay | Admitting: Family Medicine

## 2018-04-08 VITALS — BP 130/74 | Temp 98.3°F | Ht 72.0 in | Wt 214.0 lb

## 2018-04-08 DIAGNOSIS — J329 Chronic sinusitis, unspecified: Secondary | ICD-10-CM | POA: Diagnosis not present

## 2018-04-08 DIAGNOSIS — J310 Chronic rhinitis: Secondary | ICD-10-CM

## 2018-04-08 MED ORDER — CEFPROZIL 500 MG PO TABS: 500.0000 mg | ORAL_TABLET | Freq: Two times a day (BID) | ORAL | 0 refills | Status: DC

## 2018-04-08 NOTE — Progress Notes (Signed)
   Subjective:    Patient ID: Samara Deistharles R Barua, male    DOB: [birth year], 72 y.o.   MRN: [id number]  HPI Patient is here today with complaints of a sore throat,sinus drainage,cough for the last two days.He has been taking otc sinus medication and cough drops.   Frontal heaache pos cong and stuffines/headache achy at times/sharp with cough.  Worse with change of position.  Cough somewhat.  Major fever     Review of Systems No headache, no major weight loss or weight gain, no chest pain no back pain abdominal pain no change in bowel habits complete ROS otherwise negative     Objective:   Physical Exam  Alert, mild malaise. Hydration good Vitals stable. frontal/ maxillary tenderness evident positive nasal congestion. pharynx normal neck supple  lungs clear/no crackles or wheezes. heart regular in rhythm       Assessment & Plan:  dermatitis

## 2018-06-10 ENCOUNTER — Other Ambulatory Visit: Payer: Self-pay | Admitting: Family Medicine

## 2018-06-10 NOTE — Telephone Encounter (Signed)
One mo plus one ref 

## 2018-06-22 ENCOUNTER — Telehealth: Payer: Self-pay | Admitting: Family Medicine

## 2018-06-22 DIAGNOSIS — E785 Hyperlipidemia, unspecified: Secondary | ICD-10-CM

## 2018-06-22 DIAGNOSIS — I10 Essential (primary) hypertension: Secondary | ICD-10-CM

## 2018-06-22 DIAGNOSIS — Z79899 Other long term (current) drug therapy: Secondary | ICD-10-CM

## 2018-06-22 DIAGNOSIS — Z125 Encounter for screening for malignant neoplasm of prostate: Secondary | ICD-10-CM

## 2018-06-22 NOTE — Telephone Encounter (Signed)
Patient has appointment 2/10 and would like to get labs done.

## 2018-06-22 NOTE — Telephone Encounter (Signed)
Last Labs drawn: Lip,hepatic,bmet,psa.

## 2018-06-22 NOTE — Telephone Encounter (Signed)
Last Labs drawn: Lip,hepatic,bmet,psa. 

## 2018-06-23 NOTE — Telephone Encounter (Signed)
Rep same 

## 2018-06-24 DIAGNOSIS — Z125 Encounter for screening for malignant neoplasm of prostate: Secondary | ICD-10-CM | POA: Diagnosis not present

## 2018-06-24 DIAGNOSIS — E785 Hyperlipidemia, unspecified: Secondary | ICD-10-CM | POA: Diagnosis not present

## 2018-06-24 DIAGNOSIS — Z79899 Other long term (current) drug therapy: Secondary | ICD-10-CM | POA: Diagnosis not present

## 2018-06-24 DIAGNOSIS — I10 Essential (primary) hypertension: Secondary | ICD-10-CM | POA: Diagnosis not present

## 2018-06-24 NOTE — Telephone Encounter (Signed)
Patient is aware the labs have been placed.

## 2018-06-24 NOTE — Telephone Encounter (Signed)
Blood work ordered in Epic. Left message to return call to notify patient. 

## 2018-06-25 LAB — BASIC METABOLIC PANEL
BUN/Creatinine Ratio: 20 (ref 10–24)
BUN: 20 mg/dL (ref 8–27)
CO2: 24 mmol/L (ref 20–29)
Calcium: 9.7 mg/dL (ref 8.6–10.2)
Chloride: 100 mmol/L (ref 96–106)
Creatinine, Ser: 1 mg/dL (ref 0.76–1.27)
GFR calc Af Amer: 87 mL/min/{1.73_m2} (ref >59)
GFR calc non Af Amer: 75 mL/min/{1.73_m2} (ref >59)
Glucose: 116 mg/dL — ABNORMAL HIGH (ref 65–99)
Potassium: 4.4 mmol/L (ref 3.5–5.2)
Sodium: 141 mmol/L (ref 134–144)

## 2018-06-25 LAB — PSA: Prostate Specific Ag, Serum: 4.2 ng/mL — ABNORMAL HIGH (ref 0.0–4.0)

## 2018-06-25 LAB — LIPID PANEL
Chol/HDL Ratio: 3.6 ratio (ref 0.0–5.0)
Cholesterol, Total: 200 mg/dL — ABNORMAL HIGH (ref 100–199)
HDL: 55 mg/dL (ref >39)
LDL Calculated: 129 mg/dL — ABNORMAL HIGH (ref 0–99)
Triglycerides: 81 mg/dL (ref 0–149)
VLDL Cholesterol Cal: 16 mg/dL (ref 5–40)

## 2018-06-25 LAB — HEPATIC FUNCTION PANEL
ALT: 17 IU/L (ref 0–44)
AST: 18 IU/L (ref 0–40)
Albumin: 4.7 g/dL (ref 3.7–4.7)
Alkaline Phosphatase: 59 IU/L (ref 39–117)
Bilirubin Total: 0.6 mg/dL (ref 0.0–1.2)
Bilirubin, Direct: 0.17 mg/dL (ref 0.00–0.40)
Total Protein: 7.1 g/dL (ref 6.0–8.5)

## 2018-07-05 ENCOUNTER — Ambulatory Visit (INDEPENDENT_AMBULATORY_CARE_PROVIDER_SITE_OTHER): Payer: Medicare Other | Admitting: Family Medicine

## 2018-07-05 ENCOUNTER — Encounter: Payer: Self-pay | Admitting: Family Medicine

## 2018-07-05 VITALS — BP 132/76 | Ht 71.5 in | Wt 210.0 lb

## 2018-07-05 DIAGNOSIS — N400 Enlarged prostate without lower urinary tract symptoms: Secondary | ICD-10-CM | POA: Diagnosis not present

## 2018-07-05 DIAGNOSIS — E785 Hyperlipidemia, unspecified: Secondary | ICD-10-CM

## 2018-07-05 DIAGNOSIS — I10 Essential (primary) hypertension: Secondary | ICD-10-CM

## 2018-07-05 DIAGNOSIS — Z0000 Encounter for general adult medical examination without abnormal findings: Secondary | ICD-10-CM | POA: Diagnosis not present

## 2018-07-05 DIAGNOSIS — M720 Palmar fascial fibromatosis [Dupuytren]: Secondary | ICD-10-CM | POA: Diagnosis not present

## 2018-07-05 DIAGNOSIS — R7301 Impaired fasting glucose: Secondary | ICD-10-CM

## 2018-07-05 DIAGNOSIS — R972 Elevated prostate specific antigen [PSA]: Secondary | ICD-10-CM | POA: Diagnosis not present

## 2018-07-05 MED ORDER — INDAPAMIDE 2.5 MG PO TABS: 2.5000 mg | ORAL_TABLET | Freq: Every day | ORAL | 5 refills | Status: DC

## 2018-07-05 MED ORDER — KETOCONAZOLE 2 % EX CREA: TOPICAL_CREAM | CUTANEOUS | 5 refills | Status: DC

## 2018-07-05 MED ORDER — DOXAZOSIN MESYLATE 2 MG PO TABS: 2.0000 mg | ORAL_TABLET | Freq: Every day | ORAL | 5 refills | Status: DC

## 2018-07-05 MED ORDER — BENAZEPRIL HCL 20 MG PO TABS: ORAL_TABLET | ORAL | 5 refills | Status: DC

## 2018-07-05 MED ORDER — AMLODIPINE BESYLATE 5 MG PO TABS: ORAL_TABLET | ORAL | 5 refills | Status: DC

## 2018-07-05 NOTE — Patient Instructions (Signed)
Results for orders placed or performed in visit on 06/22/18  Lipid panel  Result Value Ref Range   Cholesterol, Total 200 (H) 100 - 199 mg/dL   Triglycerides 81 0 - 149 mg/dL   HDL 55 >50 mg/dL   VLDL Cholesterol Cal 16 5 - 40 mg/dL   LDL Calculated 722 (H) 0 - 99 mg/dL   Chol/HDL Ratio 3.6 0.0 - 5.0 ratio  Hepatic function panel  Result Value Ref Range   Total Protein 7.1 6.0 - 8.5 g/dL   Albumin 4.7 3.7 - 4.7 g/dL   Bilirubin Total 0.6 0.0 - 1.2 mg/dL   Bilirubin, Direct 5.75 0.00 - 0.40 mg/dL   Alkaline Phosphatase 59 39 - 117 IU/L   AST 18 0 - 40 IU/L   ALT 17 0 - 44 IU/L  Basic metabolic panel  Result Value Ref Range   Glucose 116 (H) 65 - 99 mg/dL   BUN 20 8 - 27 mg/dL   Creatinine, Ser 0.51 0.76 - 1.27 mg/dL   GFR calc non Af Amer 75 >59 mL/min/1.73   GFR calc Af Amer 87 >59 mL/min/1.73   BUN/Creatinine Ratio 20 10 - 24   Sodium 141 134 - 144 mmol/L   Potassium 4.4 3.5 - 5.2 mmol/L   Chloride 100 96 - 106 mmol/L   CO2 24 20 - 29 mmol/L   Calcium 9.7 8.6 - 10.2 mg/dL  PSA  Result Value Ref Range   Prostate Specific Ag, Serum 4.2 (H) 0.0 - 4.0 ng/mL

## 2018-07-05 NOTE — Progress Notes (Signed)
Subjective:    Patient ID: [name], male    DOB: [birth year], 73 y.o.   MRN: [id number]  HPI AWV- Annual Wellness Visit  The patient was seen for their annual wellness visit. The patient's past medical history, surgical history, and family history were reviewed. Pertinent vaccines were reviewed ( tetanus, pneumonia, shingles, flu) The patient's medication list was reviewed and updated.  The height and weight were entered.  BMI recorded in electronic record elsewhere  Cognitive screening was completed. Outcome of Mini - Cog: pass   Falls /depression screening electronically recorded within record elsewhere  Current tobacco usage:none (All patients who use tobacco were given written and verbal information on quitting)  Recent listing of emergency department/hospitalizations over the past year were reviewed.  current specialist the patient sees on a regular basis: none   Medicare annual wellness visit patient questionnaire was reviewed.  A written screening schedule for the patient for the next 5-10 years was given. Appropriate discussion of followup regarding next visit was discussed.    Results for orders placed or performed in visit on 06/22/18  Lipid panel  Result Value Ref Range   Cholesterol, Total 200 (H) 100 - 199 mg/dL   Triglycerides 81 0 - 149 mg/dL   HDL 55 >00 mg/dL   VLDL Cholesterol Cal 16 5 - 40 mg/dL   LDL Calculated 923 (H) 0 - 99 mg/dL   Chol/HDL Ratio 3.6 0.0 - 5.0 ratio  Hepatic function panel  Result Value Ref Range   Total Protein 7.1 6.0 - 8.5 g/dL   Albumin 4.7 3.7 - 4.7 g/dL   Bilirubin Total 0.6 0.0 - 1.2 mg/dL   Bilirubin, Direct 3.00 0.00 - 0.40 mg/dL   Alkaline Phosphatase 59 39 - 117 IU/L   AST 18 0 - 40 IU/L   ALT 17 0 - 44 IU/L  Basic metabolic panel  Result Value Ref Range   Glucose 116 (H) 65 - 99 mg/dL   BUN 20 8 - 27 mg/dL   Creatinine, Ser 7.62 0.76 - 1.27 mg/dL   GFR calc non Af Amer 75 >59 mL/min/1.73   GFR calc  Af Amer 87 >59 mL/min/1.73   BUN/Creatinine Ratio 20 10 - 24   Sodium 141 134 - 144 mmol/L   Potassium 4.4 3.5 - 5.2 mmol/L   Chloride 100 96 - 106 mmol/L   CO2 24 20 - 29 mmol/L   Calcium 9.7 8.6 - 10.2 mg/dL  PSA  Result Value Ref Range   Prostate Specific Ag, Serum 4.2 (H) 0.0 - 4.0 ng/mL    Blood pressure medicine and blood pressure levels reviewed today with patient. Compliant with blood pressure medicine. States does not miss a dose. No obvious side effects. Blood pressure generally good when checked elsewhere. Watching salt intake.  Prediabetes.  We have been watching his sugars.  Naltrexone even further to 116 fasting.  Some family history of diabetes  Ongoing recurrent rash.  See prior notes.  Ketoconazole helped last time requests medication   No prost cancer hx      Review of Systems  Constitutional: Negative for activity change, appetite change and fever.  HENT: Negative for congestion and rhinorrhea.   Eyes: Negative for discharge.  Respiratory: Negative for cough and wheezing.   Cardiovascular: Negative for chest pain.  Gastrointestinal: Negative for abdominal pain, blood in stool and vomiting.  Genitourinary: Negative for difficulty urinating and frequency.  Musculoskeletal: Negative for neck pain.  Skin: Negative for rash.  Allergic/Immunologic:  Negative for environmental allergies and food allergies.  Neurological: Negative for weakness and headaches.  Psychiatric/Behavioral: Negative for agitation.  All other systems reviewed and are negative.      Objective:   Physical Exam Constitutional:      Appearance: He is well-developed.  HENT:     Head: Normocephalic and atraumatic.     Right Ear: External ear normal.     Left Ear: External ear normal.     Nose: Nose normal.  Eyes:     Pupils: Pupils are equal, round, and reactive to light.  Neck:     Musculoskeletal: Normal range of motion and neck supple.     Thyroid: No thyromegaly.  Cardiovascular:       Rate and Rhythm: Normal rate and regular rhythm.     Heart sounds: Normal heart sounds. No murmur.  Pulmonary:     Effort: Pulmonary effort is normal. No respiratory distress.     Breath sounds: Normal breath sounds. No wheezing.  Abdominal:     General: Bowel sounds are normal. There is no distension.     Palpations: Abdomen is soft. There is no mass.     Tenderness: There is no abdominal tenderness.  Genitourinary:    Penis: Normal.      Prostate: Normal.  Musculoskeletal: Normal range of motion.     Comments: Left hand deeply Karenz contracture noted  Lymphadenopathy:     Cervical: No cervical adenopathy.  Skin:    General: Skin is warm and dry.     Findings: No erythema.  Neurological:     Mental Status: He is alert.     Motor: No abnormal muscle tone.  Psychiatric:        Behavior: Behavior normal.        Judgment: Judgment normal.           Assessment & Plan:  Impression 1 wellness exam.  Diet discussed.  Exercise discussed.  Up-to-date on vaccines.  Up-to-date on colonoscopy  2.  Hypertension very good control discussed maintain same meds  3.  Elevated PSA.  Long discussion held.  Recommend urology input rationale discussed.  4.  Prediabetes.  Ongoing challenge.  Patient working on his diet  5.  Contractures left hand patient wishes to hold off on specialist revisit now which is understandable with history of reflex sympathetic dystrophy with prior intervention  6.  Recurrent rash ketoconazole helped will refill same meds  Follow-up in 6 months

## 2018-07-13 ENCOUNTER — Encounter: Payer: Self-pay | Admitting: Family Medicine

## 2018-08-07 ENCOUNTER — Other Ambulatory Visit: Payer: Self-pay | Admitting: Family Medicine

## 2018-10-12 ENCOUNTER — Ambulatory Visit (INDEPENDENT_AMBULATORY_CARE_PROVIDER_SITE_OTHER): Payer: Medicare Other | Admitting: Urology

## 2018-10-12 DIAGNOSIS — N400 Enlarged prostate without lower urinary tract symptoms: Secondary | ICD-10-CM | POA: Diagnosis not present

## 2018-10-12 DIAGNOSIS — R972 Elevated prostate specific antigen [PSA]: Secondary | ICD-10-CM | POA: Diagnosis not present

## 2018-11-09 DIAGNOSIS — H25013 Cortical age-related cataract, bilateral: Secondary | ICD-10-CM | POA: Diagnosis not present

## 2018-11-23 ENCOUNTER — Other Ambulatory Visit: Payer: Self-pay

## 2018-11-23 ENCOUNTER — Other Ambulatory Visit: Payer: Medicare Other

## 2018-11-23 DIAGNOSIS — R6889 Other general symptoms and signs: Secondary | ICD-10-CM | POA: Diagnosis not present

## 2018-11-23 DIAGNOSIS — Z20822 Contact with and (suspected) exposure to covid-19: Secondary | ICD-10-CM

## 2018-11-30 LAB — NOVEL CORONAVIRUS, NAA: SARS-CoV-2, NAA: NOT DETECTED

## 2019-01-03 ENCOUNTER — Ambulatory Visit: Payer: Medicare Other | Admitting: Family Medicine

## 2019-01-06 ENCOUNTER — Other Ambulatory Visit: Payer: Self-pay | Admitting: Family Medicine

## 2019-01-06 NOTE — Telephone Encounter (Signed)
sched six mo f u virt or face to face, then ref times one

## 2019-01-06 NOTE — Telephone Encounter (Signed)
Please contact patient to set up appt; then route back to nurses. Thank you 

## 2019-01-07 NOTE — Telephone Encounter (Signed)
Pt already has virtual 6 month follow up scheduled 8/25

## 2019-01-18 ENCOUNTER — Other Ambulatory Visit: Payer: Self-pay

## 2019-01-18 ENCOUNTER — Encounter: Payer: Self-pay | Admitting: Family Medicine

## 2019-01-18 ENCOUNTER — Ambulatory Visit (INDEPENDENT_AMBULATORY_CARE_PROVIDER_SITE_OTHER): Payer: Medicare Other | Admitting: Family Medicine

## 2019-01-18 DIAGNOSIS — I10 Essential (primary) hypertension: Secondary | ICD-10-CM

## 2019-01-18 MED ORDER — DOXAZOSIN MESYLATE 2 MG PO TABS: 2.0000 mg | ORAL_TABLET | Freq: Every day | ORAL | 5 refills | Status: DC

## 2019-01-18 MED ORDER — BENAZEPRIL HCL 20 MG PO TABS: ORAL_TABLET | ORAL | 5 refills | Status: DC

## 2019-01-18 MED ORDER — AMLODIPINE BESYLATE 5 MG PO TABS: ORAL_TABLET | ORAL | 5 refills | Status: DC

## 2019-01-18 MED ORDER — KETOCONAZOLE 2 % EX CREA: TOPICAL_CREAM | CUTANEOUS | 5 refills | Status: DC

## 2019-01-18 MED ORDER — INDAPAMIDE 2.5 MG PO TABS: 2.5000 mg | ORAL_TABLET | Freq: Every day | ORAL | 5 refills | Status: DC

## 2019-01-18 NOTE — Progress Notes (Signed)
   Subjective:  Patient presents via audio for blood pressure follow-up and other chronic conditions  Patient ID: Richard Becker, male    DOB: 08/31/1945, 73 y.o.   MRN: [id number]  Hypertension This is a chronic problem. Risk factors for coronary artery disease include male gender. There are no compliance problems.   pt states he checks his blood pressure every so often. Pt last BP reading was on 01/16/2019 131/72.  Virtual Visit via Video Note  I connected with Richard Becker on 01/18/19 at  8:30 AM EDT by a video enabled telemedicine application and verified that I am speaking with the correct person using two identifiers.  Location: Patient: home Provider: office   I discussed the limitations of evaluation and management by telemedicine and the availability of in person appointments. The patient expressed understanding and agreed to proceed.  History of Present Illness:    Observations/Objective:   Assessment and Plan:   Follow Up Instructions:    I discussed the assessment and treatment plan with the patient. The patient was provided an opportunity to ask questions and all were answered. The patient agreed with the plan and demonstrated an understanding of the instructions.   The patient was advised to call back or seek an in-person evaluation if the symptoms worsen or if the condition fails to improve as anticipated.  I provided 18 minutes of non-face-to-face time during this encounter.   Richard Males, LPN When he checks his blood pressure systolics generally in the 130s.  Diastolics in the 28J.  Compliant with medications Blood pressure medicine and blood pressure levels reviewed today with patient. Compliant with blood pressure medicine. States does not miss a dose. No obvious side effects. Blood pressure generally good when checked elsewhere. Watching salt intake.     Review of Systems No headache, no major weight loss or weight gain, no chest pain no back  pain abdominal pain no change in bowel habits complete ROS otherwise negative     Objective:   Physical Exam  Virtual      Assessment & Plan:  Impression 1 hypertension.  Good control discussed on 5 discussed maintain same medicine the same dose  2.  Elevated PSA now followed by urologist  Follow-up in 6 months diet exercise discussed medication refill

## 2019-03-22 DIAGNOSIS — Z23 Encounter for immunization: Secondary | ICD-10-CM | POA: Diagnosis not present

## 2019-04-07 DIAGNOSIS — N400 Enlarged prostate without lower urinary tract symptoms: Secondary | ICD-10-CM | POA: Diagnosis not present

## 2019-04-19 ENCOUNTER — Ambulatory Visit (INDEPENDENT_AMBULATORY_CARE_PROVIDER_SITE_OTHER): Payer: Medicare Other | Admitting: Urology

## 2019-04-19 DIAGNOSIS — R972 Elevated prostate specific antigen [PSA]: Secondary | ICD-10-CM

## 2019-06-30 ENCOUNTER — Encounter: Payer: Self-pay | Admitting: Family Medicine

## 2019-07-06 ENCOUNTER — Other Ambulatory Visit: Payer: Self-pay | Admitting: Family Medicine

## 2019-07-07 NOTE — Telephone Encounter (Signed)
Ok times one with one ref rec pe plus chronic in 4 to 6 weeks

## 2019-07-08 NOTE — Telephone Encounter (Signed)
Last labs 06/24/18 - lipid, liver, bmp, psa

## 2019-07-08 NOTE — Telephone Encounter (Signed)
Scheduled 3/9 for CPE, does pt need lab work done?   Please call pt when labs are ordered.

## 2019-07-08 NOTE — Telephone Encounter (Signed)
Please schedule

## 2019-07-22 ENCOUNTER — Telehealth: Payer: Self-pay | Admitting: Family Medicine

## 2019-07-22 DIAGNOSIS — E785 Hyperlipidemia, unspecified: Secondary | ICD-10-CM

## 2019-07-22 DIAGNOSIS — Z79899 Other long term (current) drug therapy: Secondary | ICD-10-CM

## 2019-07-22 DIAGNOSIS — Z125 Encounter for screening for malignant neoplasm of prostate: Secondary | ICD-10-CM

## 2019-07-22 DIAGNOSIS — R7301 Impaired fasting glucose: Secondary | ICD-10-CM

## 2019-07-22 DIAGNOSIS — I10 Essential (primary) hypertension: Secondary | ICD-10-CM

## 2019-07-22 NOTE — Telephone Encounter (Signed)
Last labs 06/24/18 psa, bmp, liver, lipid

## 2019-07-22 NOTE — Telephone Encounter (Signed)
Patient has appointment for physical on 3/9 and wanting labs done.

## 2019-07-24 NOTE — Telephone Encounter (Signed)
Rep same 

## 2019-07-25 NOTE — Telephone Encounter (Signed)
Blood work ordered in Epic. Patient notified and verbalized understanding. ?

## 2019-07-26 DIAGNOSIS — Z79899 Other long term (current) drug therapy: Secondary | ICD-10-CM | POA: Diagnosis not present

## 2019-07-26 DIAGNOSIS — R7301 Impaired fasting glucose: Secondary | ICD-10-CM | POA: Diagnosis not present

## 2019-07-26 DIAGNOSIS — E785 Hyperlipidemia, unspecified: Secondary | ICD-10-CM | POA: Diagnosis not present

## 2019-07-26 DIAGNOSIS — I10 Essential (primary) hypertension: Secondary | ICD-10-CM | POA: Diagnosis not present

## 2019-07-26 DIAGNOSIS — Z125 Encounter for screening for malignant neoplasm of prostate: Secondary | ICD-10-CM | POA: Diagnosis not present

## 2019-07-27 LAB — LIPID PANEL
Chol/HDL Ratio: 3.8 ratio (ref 0.0–5.0)
Cholesterol, Total: 180 mg/dL (ref 100–199)
HDL: 48 mg/dL (ref >39)
LDL Chol Calc (NIH): 117 mg/dL — ABNORMAL HIGH (ref 0–99)
Triglycerides: 78 mg/dL (ref 0–149)
VLDL Cholesterol Cal: 15 mg/dL (ref 5–40)

## 2019-07-27 LAB — HEPATIC FUNCTION PANEL
ALT: 20 IU/L (ref 0–44)
AST: 22 IU/L (ref 0–40)
Albumin: 4.5 g/dL (ref 3.7–4.7)
Alkaline Phosphatase: 62 IU/L (ref 39–117)
Bilirubin Total: 0.6 mg/dL (ref 0.0–1.2)
Bilirubin, Direct: 0.17 mg/dL (ref 0.00–0.40)
Total Protein: 6.9 g/dL (ref 6.0–8.5)

## 2019-07-27 LAB — BASIC METABOLIC PANEL
BUN/Creatinine Ratio: 21 (ref 10–24)
BUN: 22 mg/dL (ref 8–27)
CO2: 20 mmol/L (ref 20–29)
Calcium: 9.6 mg/dL (ref 8.6–10.2)
Chloride: 103 mmol/L (ref 96–106)
Creatinine, Ser: 1.03 mg/dL (ref 0.76–1.27)
GFR calc Af Amer: 84 mL/min/{1.73_m2} (ref >59)
GFR calc non Af Amer: 72 mL/min/{1.73_m2} (ref >59)
Glucose: 105 mg/dL — ABNORMAL HIGH (ref 65–99)
Potassium: 3.9 mmol/L (ref 3.5–5.2)
Sodium: 140 mmol/L (ref 134–144)

## 2019-07-27 LAB — PSA: Prostate Specific Ag, Serum: 4 ng/mL (ref 0.0–4.0)

## 2019-08-02 ENCOUNTER — Other Ambulatory Visit: Payer: Self-pay

## 2019-08-02 ENCOUNTER — Encounter: Payer: Self-pay | Admitting: Family Medicine

## 2019-08-02 ENCOUNTER — Ambulatory Visit (INDEPENDENT_AMBULATORY_CARE_PROVIDER_SITE_OTHER): Payer: Medicare Other | Admitting: Family Medicine

## 2019-08-02 VITALS — BP 126/78 | Temp 98.2°F | Ht 72.5 in | Wt 205.0 lb

## 2019-08-02 DIAGNOSIS — Z0000 Encounter for general adult medical examination without abnormal findings: Secondary | ICD-10-CM

## 2019-08-02 DIAGNOSIS — I10 Essential (primary) hypertension: Secondary | ICD-10-CM

## 2019-08-02 MED ORDER — BENAZEPRIL HCL 20 MG PO TABS: ORAL_TABLET | ORAL | 1 refills | Status: DC

## 2019-08-02 MED ORDER — AMLODIPINE BESYLATE 5 MG PO TABS: ORAL_TABLET | ORAL | 1 refills | Status: DC

## 2019-08-02 MED ORDER — INDAPAMIDE 2.5 MG PO TABS: 2.5000 mg | ORAL_TABLET | Freq: Every day | ORAL | 1 refills | Status: DC

## 2019-08-02 MED ORDER — DOXAZOSIN MESYLATE 2 MG PO TABS: 2.0000 mg | ORAL_TABLET | Freq: Every day | ORAL | 1 refills | Status: DC

## 2019-08-02 NOTE — Progress Notes (Signed)
Subjective:    Patient ID: Richard Becker, male    DOB: Aug 13, 1945, 74 y.o.   MRN: [id number]  HPI The patient comes in today for a wellness visit.    A review of their health history was completed.  A review of medications was also completed.  Any needed refills; update all meds  Eating habits: health conscious  Falls/  MVA accidents in past few months: none  Regular exercise: walking, yard work  [employer] pt sees on regular basis: dr Retta Diones for elevated psa  Preventative health issues were discussed.   Additional concerns:   Results for orders placed or performed in visit on 07/22/19  Lipid panel  Result Value Ref Range   Cholesterol, Total 180 100 - 199 mg/dL   Triglycerides 78 0 - 149 mg/dL   HDL 48 >81 mg/dL   VLDL Cholesterol Cal 15 5 - 40 mg/dL   LDL Chol Calc (NIH) 448 (H) 0 - 99 mg/dL   Chol/HDL Ratio 3.8 0.0 - 5.0 ratio  Hepatic function panel  Result Value Ref Range   Total Protein 6.9 6.0 - 8.5 g/dL   Albumin 4.5 3.7 - 4.7 g/dL   Bilirubin Total 0.6 0.0 - 1.2 mg/dL   Bilirubin, Direct 1.85 0.00 - 0.40 mg/dL   Alkaline Phosphatase 62 39 - 117 IU/L   AST 22 0 - 40 IU/L   ALT 20 0 - 44 IU/L  Basic metabolic panel  Result Value Ref Range   Glucose 105 (H) 65 - 99 mg/dL   BUN 22 8 - 27 mg/dL   Creatinine, Ser 6.31 0.76 - 1.27 mg/dL   GFR calc non Af Amer 72 >59 mL/min/1.73   GFR calc Af Amer 84 >59 mL/min/1.73   BUN/Creatinine Ratio 21 10 - 24   Sodium 140 134 - 144 mmol/L   Potassium 3.9 3.5 - 5.2 mmol/L   Chloride 103 96 - 106 mmol/L   CO2 20 20 - 29 mmol/L   Calcium 9.6 8.6 - 10.2 mg/dL  PSA  Result Value Ref Range   Prostate Specific Ag, Serum 4.0 0.0 - 4.0 ng/mL   Exercising reg the past few weeks   Blood pressure medicine and blood pressure levels reviewed today with patient. Compliant with blood pressure medicine. States does not miss a dose. No obvious side effects. Blood pressure generally good when checked elsewhere. Watching  salt intake.      Review of Systems  Constitutional: Negative for activity change, appetite change and fever.  HENT: Negative for congestion and rhinorrhea.   Eyes: Negative for discharge.  Respiratory: Negative for cough and wheezing.   Cardiovascular: Negative for chest pain.  Gastrointestinal: Negative for abdominal pain, blood in stool and vomiting.  Genitourinary: Negative for difficulty urinating and frequency.  Musculoskeletal: Negative for neck pain.  Skin: Negative for rash.  Allergic/Immunologic: Negative for environmental allergies and food allergies.  Neurological: Negative for weakness and headaches.  Psychiatric/Behavioral: Negative for agitation.  All other systems reviewed and are negative.      Objective:   Physical Exam Vitals reviewed.  Constitutional:      Appearance: He is well-developed.  HENT:     Head: Normocephalic and atraumatic.     Right Ear: External ear normal.     Left Ear: External ear normal.     Nose: Nose normal.  Eyes:     Pupils: Pupils are equal, round, and reactive to light.  Neck:     Thyroid: No thyromegaly.  Cardiovascular:  Rate and Rhythm: Normal rate and regular rhythm.     Heart sounds: Normal heart sounds. No murmur.  Pulmonary:     Effort: Pulmonary effort is normal. No respiratory distress.     Breath sounds: Normal breath sounds. No wheezing.  Abdominal:     General: Bowel sounds are normal. There is no distension.     Palpations: Abdomen is soft. There is no mass.     Tenderness: There is no abdominal tenderness.  Genitourinary:    Penis: Normal.   Musculoskeletal:        General: Normal range of motion.     Cervical back: Normal range of motion and neck supple.  Lymphadenopathy:     Cervical: No cervical adenopathy.  Skin:    General: Skin is warm and dry.     Findings: No erythema.  Neurological:     Mental Status: He is alert.     Motor: No abnormal muscle tone.  Psychiatric:        Behavior:  Behavior normal.        Judgment: Judgment normal.           Assessment & Plan:  Impression wellness exam.  Diet discussed.  Exercise discussed.  Up-to-date on colonoscopy.  Blood work reviewed.  2.  Hypertension.  Good control discussed to maintain same meds.  Medications refilled diet discussed.  3.  Borderline PSA.  Currently followed by the urologist.  Petra Kuba of uncertainty with borderline PSA discussed with patient.  Follow-up in 6 months.Meds refilled diet exercise discussed

## 2019-10-18 ENCOUNTER — Other Ambulatory Visit: Payer: Self-pay

## 2019-10-18 ENCOUNTER — Ambulatory Visit (INDEPENDENT_AMBULATORY_CARE_PROVIDER_SITE_OTHER): Payer: Medicare Other | Admitting: Urology

## 2019-10-18 ENCOUNTER — Encounter: Payer: Self-pay | Admitting: Urology

## 2019-10-18 VITALS — BP 127/75 | HR 78 | Temp 98.1°F | Ht 72.0 in | Wt 205.0 lb

## 2019-10-18 DIAGNOSIS — N400 Enlarged prostate without lower urinary tract symptoms: Secondary | ICD-10-CM

## 2019-10-18 DIAGNOSIS — R972 Elevated prostate specific antigen [PSA]: Secondary | ICD-10-CM | POA: Diagnosis not present

## 2019-10-18 LAB — POCT URINALYSIS DIPSTICK
Bilirubin, UA: NEGATIVE
Blood, UA: NEGATIVE
Glucose, UA: NEGATIVE
Ketones, UA: NEGATIVE
Leukocytes, UA: NEGATIVE
Nitrite, UA: NEGATIVE
Protein, UA: NEGATIVE
Spec Grav, UA: 1.025 (ref 1.010–1.025)
Urobilinogen, UA: NEGATIVE E.U./dL — AB
pH, UA: 6 (ref 5.0–8.0)

## 2019-10-18 NOTE — Progress Notes (Signed)
H&P  Chief Complaint: Elevated PSA  History of Present Illness:   5.25.2021: Most recent PSA was 4.0 (on 3.2.2021). He reports stable, minimal urinary sx's. Overall, he has very few concerns with regards to his urological health and is personally comfortable with his PSA trend thus far.   IPSS Questionnaire (AUA-7): Over the past month.   1)  How often have you had a sensation of not emptying your bladder completely after you finish urinating?  0 - Not at all  2)  How often have you had to urinate again less than two hours after you finished urinating? 1 - Less than 1 time in 5  3)  How often have you found you stopped and started again several times when you urinated?  1 - Less than 1 time in 5  4) How difficult have you found it to postpone urination?  0 - Not at all  5) How often have you had a weak urinary stream?  1 - Less than 1 time in 5  6) How often have you had to push or strain to begin urination?  0 - Not at all  7) How many times did you most typically get up to urinate from the time you went to bed until the time you got up in the morning?  1 - 1 time  Total score:  0-7 mildly symptomatic   8-19 moderately symptomatic   20-35 severely symptomatic   Total: 4 QoL: 1  (below copied from [facility] records):  Richard Becker is a 74 year-old male established patient who is here evaluation of his PSA.  He is a patient of Dr Simone Curia seen in office consultation today. His last PSA was performed 06/24/2018. The last PSA value was 4.2. The patient states he does not take 5 alpha reductase inhibitor medication. He has not undergone a prior prostate biopsy. He does not have a history of prostatitis.   74 year old male sent by Dr. Gerda Diss for evaluation and management of elevated PSA. PSA levels have been as follows:   11.19.2015--3.26  1.5.20217--3.3  1.30.2018--3.9  1.31.2019--3.9  1.30.2020--4.2.   There is no family history of prostate cancer or prostate enlargement. He was  told in the past that his prostate feels slightly enlarged. He has no significant lower urinary tract symptomatology. He has passed a couple of stones in the remote past.   04/19/2019: PSa decreased to 2.3. No new LUTS. he stopped caffeine   Past Medical History:  Diagnosis Date  . Arthritis   . Colon polyp   . Hypertension   . IFG (impaired fasting glucose)     Past Surgical History:  Procedure Laterality Date  . Bilateral hand surgery    . COLONOSCOPY    . COLONOSCOPY N/A 07/20/2014   Procedure: COLONOSCOPY;  Surgeon: Malissa Hippo, MD;  Location: [facility];  Service: Endoscopy;  Laterality: N/A;  830    Home Medications:  Allergies as of 10/18/2019   No Known Allergies     Medication List       Accurate as of Oct 18, 2019  9:00 AM. If you have any questions, ask your nurse or doctor.        amLODipine 5 MG tablet Commonly known as: NORVASC TAKE (1) TABLET BY MOUTH DAILY.   benazepril 20 MG tablet Commonly known as: LOTENSIN TAKE (1) TABLET BY MOUTH DAILY.   doxazosin 2 MG tablet Commonly known as: CARDURA Take 1 tablet (2 mg total) by mouth daily.  Garlic 644 MG Caps Take 600 mg by mouth.   indapamide 2.5 MG tablet Commonly known as: LOZOL Take 1 tablet (2.5 mg total) by mouth daily.   ketoconazole 2 % cream Commonly known as: NIZORAL APPLY TO RASH TWICE DAILY.       Allergies: No Known Allergies  Family History  Problem Relation Age of Onset  . Cancer Mother        Breast  . Heart attack Father     Social History:  reports that he has never smoked. He has never used smokeless tobacco. He reports that he does not drink alcohol or use drugs.  ROS: Urological Symptom Review Patient is experiencing the following symptoms: None Review of Systems Gastrointestinal (upper)  : Negative for upper GI symptoms Gastrointestinal (lower) : Negative for lower GI symptoms Constitutional : Negative for symptoms Skin: Negative for skin  symptoms Eyes: Negative for eye symptoms Ear/Nose/Throat : Negative for Ear/Nose/Throat symptoms Hematologic/Lymphatic: Negative for Hematologic/Lymphatic symptoms Cardiovascular : Negative for cardiovascular symptoms Respiratory : Negative for respiratory symptoms Endocrine: Negative for endocrine symptoms Musculoskeletal: Negative for musculoskeletal symptoms Neurological: Negative for neurological symptoms Psychologic: Negative for psychiatric symptoms  Physical Exam:  Vital signs in last 24 hours: There were no vitals taken for this visit. Constitutional:  Alert and oriented, No acute distress Cardiovascular: Regular rate .  Genitourinary: Not completed Neurologic: Grossly intact, no focal deficits Psychiatric: Normal mood and affect  I have reviewed prior pt notes  I have reviewed notes from referring/previous physicians  I have reviewed prior PSA results  Impression/Assessment:  His PSA continues to fluctuate but is generally stable over the last few years. No voiding sx's. Overall, his PSA trends is not really concerning to me and I am fine to just continue with screening until it demonstrates a durable elevation. Seeing as his DRE was apparently stable w/o nodularity per his PCP, I am fine not completing an exam today.    Plan:  1. Return for OV in 1 yr w/ PSA  2. We discussed possible treatment for his ED, but he is uninterested at present and would like to discuss this next year.

## 2019-10-18 NOTE — Progress Notes (Signed)
See notes

## 2019-11-03 ENCOUNTER — Other Ambulatory Visit: Payer: Self-pay | Admitting: Family Medicine

## 2020-01-26 ENCOUNTER — Telehealth: Payer: Self-pay | Admitting: Family Medicine

## 2020-01-26 NOTE — Telephone Encounter (Signed)
Pt is needing a refill on  amLODipine (NORVASC) 5 MG tablet benazepril (LOTENSIN) 20 MG tablet doxazosin (CARDURA) 2 MG tablet indapamide (LOZOL) 2.5 MG tablet  [facility] - [place], [place] - [address]  Pt has appt on 10/7 to est with Dr. Ladona Ridgel. He will be out of medication next week.

## 2020-01-27 MED ORDER — DOXAZOSIN MESYLATE 2 MG PO TABS: 2.0000 mg | ORAL_TABLET | Freq: Every day | ORAL | 0 refills | Status: DC

## 2020-01-27 MED ORDER — BENAZEPRIL HCL 20 MG PO TABS: ORAL_TABLET | ORAL | 0 refills | Status: DC

## 2020-01-27 MED ORDER — AMLODIPINE BESYLATE 5 MG PO TABS: ORAL_TABLET | ORAL | 0 refills | Status: DC

## 2020-01-27 MED ORDER — INDAPAMIDE 2.5 MG PO TABS: 2.5000 mg | ORAL_TABLET | Freq: Every day | ORAL | 0 refills | Status: DC

## 2020-01-27 NOTE — Telephone Encounter (Signed)
Oh, I see has appt in 10/7, ignore my note then. Thx. Dr. Ladona Ridgel

## 2020-01-27 NOTE — Telephone Encounter (Signed)
Prescriptions sent electronically to pharmacy by Dr Ladona Ridgel. Patient notified.

## 2020-03-01 ENCOUNTER — Ambulatory Visit (INDEPENDENT_AMBULATORY_CARE_PROVIDER_SITE_OTHER): Payer: Medicare Other | Admitting: Family Medicine

## 2020-03-01 ENCOUNTER — Encounter: Payer: Self-pay | Admitting: Family Medicine

## 2020-03-01 ENCOUNTER — Other Ambulatory Visit: Payer: Self-pay

## 2020-03-01 VITALS — BP 118/78 | HR 94 | Temp 98.5°F | Wt 206.2 lb

## 2020-03-01 DIAGNOSIS — I10 Essential (primary) hypertension: Secondary | ICD-10-CM | POA: Diagnosis not present

## 2020-03-01 DIAGNOSIS — E782 Mixed hyperlipidemia: Secondary | ICD-10-CM | POA: Diagnosis not present

## 2020-03-01 DIAGNOSIS — R7301 Impaired fasting glucose: Secondary | ICD-10-CM | POA: Diagnosis not present

## 2020-03-01 DIAGNOSIS — N400 Enlarged prostate without lower urinary tract symptoms: Secondary | ICD-10-CM | POA: Diagnosis not present

## 2020-03-01 MED ORDER — AMLODIPINE BESYLATE 5 MG PO TABS
ORAL_TABLET | ORAL | 0 refills | Status: DC
Start: 2020-05-01 — End: 2020-07-31

## 2020-03-01 MED ORDER — INDAPAMIDE 2.5 MG PO TABS
2.5000 mg | ORAL_TABLET | Freq: Every day | ORAL | 0 refills | Status: DC
Start: 2020-05-01 — End: 2020-07-31

## 2020-03-01 MED ORDER — DOXAZOSIN MESYLATE 2 MG PO TABS
2.0000 mg | ORAL_TABLET | Freq: Every day | ORAL | 0 refills | Status: DC
Start: 2020-05-01 — End: 2020-07-31

## 2020-03-01 MED ORDER — KETOCONAZOLE 2 % EX CREA
TOPICAL_CREAM | CUTANEOUS | 2 refills | Status: DC
Start: 2020-03-01 — End: 2020-10-30

## 2020-03-01 MED ORDER — BENAZEPRIL HCL 20 MG PO TABS
ORAL_TABLET | ORAL | 0 refills | Status: DC
Start: 2020-05-01 — End: 2020-07-31

## 2020-03-01 NOTE — Progress Notes (Addendum)
Patient ID: BUEFORD ARP, male    DOB: Feb 24, 1946, 74 y.o.   MRN: [id number]   Chief Complaint  Patient presents with  . Hypertension   Subjective:    HPI   Pt seen to f/u HTN- doing well. No SE of meds. No chest pain, sob, leg swelling, dizziness or headaches. Brought in bp numbers- mostly ranging from 213-086'V systolic, a only 2 numbers in 784O systolic. Not seeing low numbers.  No labs since 3/21.  Pt stating was only getting labs about 1x per yr on his physical. Slight elevated gluc, 105. ldl 117.  Elevated psa and bph in past- Seeing urology and had elevated psa 4.0. Seeing Dr. Diona Fanti, last visit 5/21.  Seeing them next spring. They are just monitoring the psa.   Medical History Ardell has a past medical history of Arthritis, Colon polyp, Hypertension, and IFG (impaired fasting glucose).   Outpatient Encounter Medications as of 03/01/2020  Medication Sig  . [START ON 05/01/2020] amLODipine (NORVASC) 5 MG tablet TAKE (1) TABLET BY MOUTH DAILY.  Derrill Memo ON 05/01/2020] benazepril (LOTENSIN) 20 MG tablet TAKE (1) TABLET BY MOUTH DAILY.  Derrill Memo ON 05/01/2020] doxazosin (CARDURA) 2 MG tablet Take 1 tablet (2 mg total) by mouth daily.  . Garlic 962 MG CAPS Take 600 mg by mouth.  Derrill Memo ON 05/01/2020] indapamide (LOZOL) 2.5 MG tablet Take 1 tablet (2.5 mg total) by mouth daily.  Marland Kitchen ketoconazole (NIZORAL) 2 % cream APPLY TO RASH TWICE DAILY.  . [DISCONTINUED] amLODipine (NORVASC) 5 MG tablet TAKE (1) TABLET BY MOUTH DAILY.  . [DISCONTINUED] benazepril (LOTENSIN) 20 MG tablet TAKE (1) TABLET BY MOUTH DAILY.  . [DISCONTINUED] doxazosin (CARDURA) 2 MG tablet Take 1 tablet (2 mg total) by mouth daily.  . [DISCONTINUED] indapamide (LOZOL) 2.5 MG tablet Take 1 tablet (2.5 mg total) by mouth daily.  . [DISCONTINUED] ketoconazole (NIZORAL) 2 % cream APPLY TO RASH TWICE DAILY.   No facility-administered encounter medications on file as of 03/01/2020.     Review of Systems   Constitutional: Negative for chills and fever.  HENT: Negative for congestion, rhinorrhea and sore throat.   Respiratory: Negative for cough, shortness of breath and wheezing.   Cardiovascular: Negative for chest pain and leg swelling.  Gastrointestinal: Negative for abdominal pain, blood in stool, diarrhea, nausea and vomiting.  Genitourinary: Negative for decreased urine volume, difficulty urinating, discharge, dysuria, flank pain, frequency, hematuria, penile pain, penile swelling and urgency.  Skin: Negative for rash.  Neurological: Negative for dizziness, weakness and headaches.     Vitals BP 118/78   Pulse 94   Temp 98.5 F (36.9 C)   Wt 206 lb 3.2 oz (93.5 kg)   SpO2 97%   BMI 27.97 kg/m   Objective:   Physical Exam Vitals and nursing note reviewed.  Constitutional:      General: He is not in acute distress.    Appearance: Normal appearance. He is not ill-appearing.  HENT:     Head: Normocephalic.     Nose: Nose normal. No congestion.     Mouth/Throat:     Mouth: Mucous membranes are moist.     Pharynx: No oropharyngeal exudate.  Eyes:     Extraocular Movements: Extraocular movements intact.     Conjunctiva/sclera: Conjunctivae normal.     Pupils: Pupils are equal, round, and reactive to light.  Cardiovascular:     Rate and Rhythm: Normal rate and regular rhythm.     Pulses: Normal pulses.  Heart sounds: Normal heart sounds. No murmur heard.   Pulmonary:     Effort: Pulmonary effort is normal.     Breath sounds: Normal breath sounds. No wheezing, rhonchi or rales.  Musculoskeletal:        General: Normal range of motion.     Right lower leg: No edema.     Left lower leg: No edema.  Skin:    General: Skin is warm and dry.     Findings: No rash.  Neurological:     General: No focal deficit present.     Mental Status: He is alert and oriented to person, place, and time.     Cranial Nerves: No cranial nerve deficit.  Psychiatric:        Mood and  Affect: Mood normal.        Behavior: Behavior normal.        Thought Content: Thought content normal.        Judgment: Judgment normal.      Assessment and Plan   1. Essential hypertension, benign - CMP14+EGFR - CBC - Lipid panel  2. Prostate hypertrophy - PSA; Future  3. Impaired fasting glucose  4. Hyperlipidemia, mixed   htn- stable, cont meds. hld- stable.  Not on meds.  Cont diet modification.  Waiting till end of oct for flu vaccine.  Sent refills till next visit.  Pt stating needing refill on ketoconazole cream in rectal area.  Labs ordered before next visit. F/u 66mo or prn.

## 2020-03-22 DIAGNOSIS — Z23 Encounter for immunization: Secondary | ICD-10-CM | POA: Diagnosis not present

## 2020-03-29 DIAGNOSIS — Z23 Encounter for immunization: Secondary | ICD-10-CM | POA: Diagnosis not present

## 2020-07-31 ENCOUNTER — Other Ambulatory Visit: Payer: Self-pay | Admitting: Family Medicine

## 2020-07-31 NOTE — Telephone Encounter (Signed)
No results found for: HGBA1C  Lab Results  Component Value Date   CREATININE 1.03 07/26/2019     Lab Results  Component Value Date   CHOL 180 07/26/2019   HDL 48 07/26/2019   LDLCALC 117 (H) 07/26/2019   TRIG 78 07/26/2019   CHOLHDL 3.8 07/26/2019     BP Readings from Last 3 Encounters:  03/01/20 118/78  10/18/19 127/75  08/02/19 126/78

## 2020-07-31 NOTE — Telephone Encounter (Signed)
Pt needs labs 1 wk prior to next visit. Dr. Ladona Ridgel

## 2020-08-01 NOTE — Telephone Encounter (Signed)
Orders already put in and pt was notified to do one week before appt.

## 2020-08-23 DIAGNOSIS — I10 Essential (primary) hypertension: Secondary | ICD-10-CM | POA: Diagnosis not present

## 2020-08-24 LAB — CMP14+EGFR
ALT: 19 IU/L (ref 0–44)
AST: 17 IU/L (ref 0–40)
Albumin/Globulin Ratio: 1.7 (ref 1.2–2.2)
Albumin: 4.7 g/dL (ref 3.7–4.7)
Alkaline Phosphatase: 67 IU/L (ref 44–121)
BUN/Creatinine Ratio: 18 (ref 10–24)
BUN: 20 mg/dL (ref 8–27)
Bilirubin Total: 0.8 mg/dL (ref 0.0–1.2)
CO2: 24 mmol/L (ref 20–29)
Calcium: 9.7 mg/dL (ref 8.6–10.2)
Chloride: 101 mmol/L (ref 96–106)
Creatinine, Ser: 1.12 mg/dL (ref 0.76–1.27)
Globulin, Total: 2.7 g/dL (ref 1.5–4.5)
Glucose: 93 mg/dL (ref 65–99)
Potassium: 3.9 mmol/L (ref 3.5–5.2)
Sodium: 141 mmol/L (ref 134–144)
Total Protein: 7.4 g/dL (ref 6.0–8.5)
eGFR: 69 mL/min/{1.73_m2} (ref >59)

## 2020-08-24 LAB — CBC
Hematocrit: 41.4 % (ref 37.5–51.0)
Hemoglobin: 13.8 g/dL (ref 13.0–17.7)
MCH: 29.9 pg (ref 26.6–33.0)
MCHC: 33.3 g/dL (ref 31.5–35.7)
MCV: 90 fL (ref 79–97)
Platelets: 273 10*3/uL (ref 150–450)
RBC: 4.61 x10E6/uL (ref 4.14–5.80)
RDW: 13.4 % (ref 11.6–15.4)
WBC: 7.3 10*3/uL (ref 3.4–10.8)

## 2020-08-24 LAB — LIPID PANEL
Chol/HDL Ratio: 4.1 ratio (ref 0.0–5.0)
Cholesterol, Total: 205 mg/dL — ABNORMAL HIGH (ref 100–199)
HDL: 50 mg/dL (ref >39)
LDL Chol Calc (NIH): 139 mg/dL — ABNORMAL HIGH (ref 0–99)
Triglycerides: 90 mg/dL (ref 0–149)
VLDL Cholesterol Cal: 16 mg/dL (ref 5–40)

## 2020-08-30 ENCOUNTER — Other Ambulatory Visit: Payer: Self-pay

## 2020-08-30 ENCOUNTER — Ambulatory Visit (INDEPENDENT_AMBULATORY_CARE_PROVIDER_SITE_OTHER): Payer: Medicare Other | Admitting: Family Medicine

## 2020-08-30 VITALS — BP 120/70 | HR 67 | Temp 97.2°F | Ht 72.0 in | Wt 209.0 lb

## 2020-08-30 DIAGNOSIS — E782 Mixed hyperlipidemia: Secondary | ICD-10-CM

## 2020-08-30 DIAGNOSIS — I10 Essential (primary) hypertension: Secondary | ICD-10-CM | POA: Diagnosis not present

## 2020-08-30 DIAGNOSIS — N400 Enlarged prostate without lower urinary tract symptoms: Secondary | ICD-10-CM | POA: Diagnosis not present

## 2020-08-30 MED ORDER — BENAZEPRIL HCL 20 MG PO TABS
ORAL_TABLET | ORAL | 1 refills | Status: DC
Start: 2020-08-30 — End: 2021-03-05

## 2020-08-30 MED ORDER — AMLODIPINE BESYLATE 5 MG PO TABS
ORAL_TABLET | ORAL | 1 refills | Status: DC
Start: 2020-08-30 — End: 2020-11-30

## 2020-08-30 MED ORDER — INDAPAMIDE 2.5 MG PO TABS: 2.5000 mg | ORAL_TABLET | Freq: Every day | ORAL | 1 refills | Status: DC

## 2020-08-30 MED ORDER — DOXAZOSIN MESYLATE 2 MG PO TABS: 2.0000 mg | ORAL_TABLET | Freq: Every day | ORAL | 1 refills | Status: DC

## 2020-08-30 NOTE — Progress Notes (Signed)
Patient ID: Richard Becker, male    DOB: 12-Aug-1945, 75 y.o.   MRN: [id number]   Chief Complaint  Patient presents with  . Hypertension    And medication refills    Subjective:    HPI  Pt seen for f/u htn.  Seeing urology in June. Dr. Theresia Majors is checking on his psa.  Had an elevated one in 2020, and last one in 3/21 was 4.0.  HTN Pt compliant with BP meds.  No SEs Denies chest pain, sob, LE swelling, or blurry vision. BP ranges at home 112-127/62-75 over the past 2 months. Pt brought in recorded numbers from home.  bph- Pt taking doxazosin for bph.  No increase in urinary symptoms.    Medical History Richard Becker has a past medical history of Arthritis, Colon polyp, Hypertension, and IFG (impaired fasting glucose).   Outpatient Encounter Medications as of 08/30/2020  Medication Sig  . Garlic 300 MG CAPS Take 600 mg by mouth.  Marland Kitchen ketoconazole (NIZORAL) 2 % cream APPLY TO RASH TWICE DAILY.  . [DISCONTINUED] amLODipine (NORVASC) 5 MG tablet TAKE ONE TABLET BY MOUTH ONCE DAILY  . [DISCONTINUED] benazepril (LOTENSIN) 20 MG tablet TAKE ONE TABLET BY MOUTH ONCE DAILY  . [DISCONTINUED] doxazosin (CARDURA) 2 MG tablet TAKE ONE TABLET BY MOUTH ONCE DAILY  . [DISCONTINUED] indapamide (LOZOL) 2.5 MG tablet TAKE ONE TABLET BY MOUTH ONCE DAILY  . amLODipine (NORVASC) 5 MG tablet TAKE ONE TABLET BY MOUTH ONCE DAILY  . benazepril (LOTENSIN) 20 MG tablet TAKE ONE TABLET BY MOUTH ONCE DAILY  . doxazosin (CARDURA) 2 MG tablet Take 1 tablet (2 mg total) by mouth daily.  . indapamide (LOZOL) 2.5 MG tablet Take 1 tablet (2.5 mg total) by mouth daily.   No facility-administered encounter medications on file as of 08/30/2020.     Review of Systems  Constitutional: Negative for chills and fever.  HENT: Negative for congestion, rhinorrhea and sore throat.   Respiratory: Negative for cough, shortness of breath and wheezing.   Cardiovascular: Negative for chest pain and leg swelling.   Gastrointestinal: Negative for abdominal pain, diarrhea, nausea and vomiting.  Genitourinary: Negative for dysuria and frequency.  Skin: Negative for rash.  Neurological: Negative for dizziness, weakness and headaches.     Vitals BP 120/70   Pulse 67   Temp (!) 97.2 F (36.2 C)   Ht 6' (1.829 m)   Wt 209 lb (94.8 kg)   SpO2 98%   BMI 28.35 kg/m   Objective:   Physical Exam Vitals and nursing note reviewed.  Constitutional:      General: He is not in acute distress.    Appearance: Normal appearance. He is not ill-appearing.  HENT:     Head: Normocephalic.  Eyes:     Extraocular Movements: Extraocular movements intact.     Conjunctiva/sclera: Conjunctivae normal.     Pupils: Pupils are equal, round, and reactive to light.  Cardiovascular:     Rate and Rhythm: Normal rate and regular rhythm.     Pulses: Normal pulses.     Heart sounds: Normal heart sounds. No murmur heard.   Pulmonary:     Effort: Pulmonary effort is normal.     Breath sounds: Normal breath sounds. No wheezing, rhonchi or rales.  Musculoskeletal:        General: Normal range of motion.     Right lower leg: No edema.     Left lower leg: No edema.  Skin:    General: Skin is  warm and dry.     Findings: No rash.  Neurological:     General: No focal deficit present.     Mental Status: He is alert and oriented to person, place, and time.     Cranial Nerves: No cranial nerve deficit.  Psychiatric:        Mood and Affect: Mood normal.        Behavior: Behavior normal.        Thought Content: Thought content normal.        Judgment: Judgment normal.      Assessment and Plan   1. Essential hypertension, benign  2. Hyperlipidemia, mixed  3. Prostate hypertrophy   htn- stable. Cont meds. bph- stable. Cont meds.  Cont f/u with urology for psa recheck. hld- slight elevation.  Cont to watch diet and eat low cholesterol diet.  Labs reviewed, stable.  Return in about 6 months (around 03/01/2021)  for amwv and f/u htn.

## 2020-08-31 ENCOUNTER — Telehealth: Payer: Self-pay | Admitting: Family Medicine

## 2020-08-31 NOTE — Telephone Encounter (Signed)
Spoke with patient he perfer pcp to do his AWV appts

## 2020-10-29 NOTE — Progress Notes (Signed)
History of Present Illness:   5.19.2020: sent by Dr. Gerda Diss for evaluation and management of elevated PSA. PSA levels have been as follows:   11.19.2015--3.26  1.5.20217--3.3  1.30.2018--3.9  1.31.2019--3.9  1.30.2020--4.2.   There is no family history of prostate cancer or prostate enlargement. He was told in the past that his prostate feels slightly enlarged. He has no significant lower urinary tract symptomatology. He has passed a couple of stones in the remote past.   04/19/2019: PSA decreased to 2.3.  3.2.2021: PSA 4.0.  6.7.2022: He has not had PSA drawn since last year.  He has no significant lower urinary tract symptoms.  IPSS 3, quality-of-life score 0.  He is on a small dose of doxazosin but this is for blood pressure.  He does have mild problems obtaining erections but does not want any medical therapy at this point.  Past Medical History:  Diagnosis Date  . Arthritis   . Colon polyp   . Hypertension   . IFG (impaired fasting glucose)     Past Surgical History:  Procedure Laterality Date  . Bilateral hand surgery    . COLONOSCOPY    . COLONOSCOPY N/A 07/20/2014   Procedure: COLONOSCOPY;  Surgeon: Malissa Hippo, MD;  Location: [facility];  Service: Endoscopy;  Laterality: N/A;  830    Home Medications:  Allergies as of 10/30/2020   No Known Allergies     Medication List       Accurate as of October 29, 2020  8:20 PM. If you have any questions, ask your nurse or doctor.        amLODipine 5 MG tablet Commonly known as: NORVASC TAKE ONE TABLET BY MOUTH ONCE DAILY   benazepril 20 MG tablet Commonly known as: LOTENSIN TAKE ONE TABLET BY MOUTH ONCE DAILY   doxazosin 2 MG tablet Commonly known as: CARDURA Take 1 tablet (2 mg total) by mouth daily.   Garlic 300 MG Caps Take 600 mg by mouth.   indapamide 2.5 MG tablet Commonly known as: LOZOL Take 1 tablet (2.5 mg total) by mouth daily.   ketoconazole 2 % cream Commonly known as: NIZORAL APPLY TO  RASH TWICE DAILY.       Allergies: No Known Allergies  Family History  Problem Relation Age of Onset  . Cancer Mother        Breast  . Heart attack Father     Social History:  reports that he has never smoked. He has never used smokeless tobacco. He reports that he does not drink alcohol and does not use drugs.  ROS: A complete review of systems was performed.  All systems are negative except for pertinent findings as noted.  Physical Exam:  Vital signs in last 24 hours: There were no vitals taken for this visit. Constitutional:  Alert and oriented, No acute distress Cardiovascular: Regular rate  Respiratory: Normal respiratory effort GI: Abdomen is soft, nontender, nondistended, no abdominal masses. No CVAT.  Slightly palpable groin bulges bilaterally with Valsalva. Genitourinary: Normal uncircumcised male phallus, testes are descended bilaterally and non-tender and without masses, scrotum is normal in appearance without lesions or masses, perineum is normal on inspection.  Prostate is 60 g, symmetrical, nonnodular, nontender. Lymphatic: No lymphadenopathy Neurologic: Grossly intact, no focal deficits Psychiatric: Normal mood and affect  I have reviewed prior pt notes  I have reviewed notes from referring/previous physicians  I have reviewed urinalysis results  I have reviewed prior PSA results   Impression/Assessment:  1.  BPH by size criteria without significant lower urinary tract symptoms  2.  Elevated PSA, fairly stable trend over the past 7 years.  He has not had a biopsy yet  Plan:  1 PSA will be drawn today  2.  I did offer PDE 5 inhibitors but he deferred on this  3.  Unless significant change in his PSA, I will see him back in 1 year.

## 2020-10-30 ENCOUNTER — Other Ambulatory Visit: Payer: Self-pay

## 2020-10-30 ENCOUNTER — Ambulatory Visit (INDEPENDENT_AMBULATORY_CARE_PROVIDER_SITE_OTHER): Payer: Medicare Other | Admitting: Urology

## 2020-10-30 ENCOUNTER — Encounter: Payer: Self-pay | Admitting: Urology

## 2020-10-30 VITALS — BP 143/94 | HR 75 | Ht 72.0 in | Wt 209.0 lb

## 2020-10-30 DIAGNOSIS — N400 Enlarged prostate without lower urinary tract symptoms: Secondary | ICD-10-CM

## 2020-10-30 DIAGNOSIS — R972 Elevated prostate specific antigen [PSA]: Secondary | ICD-10-CM

## 2020-10-30 LAB — URINALYSIS, ROUTINE W REFLEX MICROSCOPIC
Bilirubin, UA: NEGATIVE
Glucose, UA: NEGATIVE
Ketones, UA: NEGATIVE
Leukocytes,UA: NEGATIVE
Nitrite, UA: NEGATIVE
Protein,UA: NEGATIVE
RBC, UA: NEGATIVE
Specific Gravity, UA: 1.025 (ref 1.005–1.030)
Urobilinogen, Ur: 0.2 mg/dL (ref 0.2–1.0)
pH, UA: 6 (ref 5.0–7.5)

## 2020-10-30 NOTE — Progress Notes (Signed)

## 2020-10-31 LAB — PSA: Prostate Specific Ag, Serum: 4.2 ng/mL — ABNORMAL HIGH (ref 0.0–4.0)

## 2020-11-20 NOTE — Progress Notes (Signed)
Sent via mychart

## 2020-11-28 ENCOUNTER — Other Ambulatory Visit: Payer: Self-pay | Admitting: Family Medicine

## 2020-11-30 ENCOUNTER — Telehealth: Payer: Self-pay | Admitting: Family Medicine

## 2020-11-30 DIAGNOSIS — I10 Essential (primary) hypertension: Secondary | ICD-10-CM

## 2020-11-30 MED ORDER — AMLODIPINE BESYLATE 5 MG PO TABS: ORAL_TABLET | ORAL | 1 refills | Status: DC

## 2020-11-30 NOTE — Telephone Encounter (Signed)
Patient is requesting refill on amlodipine 5 mg for 90 day supply called into [place].

## 2020-12-13 DIAGNOSIS — H25013 Cortical age-related cataract, bilateral: Secondary | ICD-10-CM | POA: Diagnosis not present

## 2021-03-05 ENCOUNTER — Ambulatory Visit (INDEPENDENT_AMBULATORY_CARE_PROVIDER_SITE_OTHER): Payer: Medicare Other | Admitting: Family Medicine

## 2021-03-05 ENCOUNTER — Other Ambulatory Visit: Payer: Self-pay

## 2021-03-05 VITALS — BP 140/85 | HR 73 | Ht 72.0 in | Wt 209.6 lb

## 2021-03-05 DIAGNOSIS — E782 Mixed hyperlipidemia: Secondary | ICD-10-CM | POA: Diagnosis not present

## 2021-03-05 DIAGNOSIS — Z23 Encounter for immunization: Secondary | ICD-10-CM

## 2021-03-05 DIAGNOSIS — Z0000 Encounter for general adult medical examination without abnormal findings: Secondary | ICD-10-CM | POA: Insufficient documentation

## 2021-03-05 DIAGNOSIS — I10 Essential (primary) hypertension: Secondary | ICD-10-CM

## 2021-03-05 MED ORDER — INDAPAMIDE 2.5 MG PO TABS: 2.5000 mg | ORAL_TABLET | Freq: Every day | ORAL | 1 refills | Status: DC

## 2021-03-05 MED ORDER — DOXAZOSIN MESYLATE 2 MG PO TABS: 2.0000 mg | ORAL_TABLET | Freq: Every day | ORAL | 1 refills | Status: DC

## 2021-03-05 MED ORDER — BENAZEPRIL HCL 20 MG PO TABS: ORAL_TABLET | ORAL | 1 refills | Status: DC

## 2021-03-05 MED ORDER — AMLODIPINE BESYLATE 5 MG PO TABS: ORAL_TABLET | ORAL | 1 refills | Status: DC

## 2021-03-05 NOTE — Progress Notes (Signed)
Subjective:  Patient ID: Richard Becker, male    DOB: [birth year]  Age: 75 y.o. MRN: [id number]  CC: Chief Complaint  Patient presents with   Hypertension    Follow up- Needs refills of all meds Hands are dry and cracking    HPI:  75 year old male presents for follow-up and to establish care with me.  Hypertension Stable.  BP 130s/70s at home. Currently on indapamide, benazepril, Cardura, amlodipine. States that he is tolerating the medications well.  No side effects. Denies chest pain, shortness of breath.  No hypotension.  Hyperlipidemia Patient is not currently on any pharmacotherapy.  Last LDL was 139.  ASCVD risk score calculated today.  10-year risk 30.9%. Will discuss pharmacotherapy options today.  Patient Active Problem List   Diagnosis Date Noted   Health care maintenance 03/05/2021   Essential hypertension, benign 09/21/2012   Hyperlipidemia, mixed 09/21/2012   Impaired fasting glucose 09/21/2012   Prostate hypertrophy 09/21/2012    Social Hx   Social History   Socioeconomic History   Marital status: Single    Spouse name: Not on file   Number of children: Not on file   Years of education: Not on file   Highest education level: Not on file  Occupational History   Not on file  Tobacco Use   Smoking status: Never   Smokeless tobacco: Never  Substance and Sexual Activity   Alcohol use: No   Drug use: No   Sexual activity: Not on file  Other Topics Concern   Not on file  Social History Narrative   Not on file   Social Determinants of Health   Financial Resource Strain: Not on file  Food Insecurity: Not on file  Transportation Needs: Not on file  Physical Activity: Not on file  Stress: Not on file  Social Connections: Not on file    Review of Systems  Constitutional: Negative.   Respiratory: Negative.    Cardiovascular: Negative.   Genitourinary: Negative.     Objective:  BP 140/85   Pulse 73   Ht 6' (1.829 m)   Wt 209 lb 9.6 oz  (95.1 kg)   SpO2 97%   BMI 28.43 kg/m   BP/Weight 03/05/2021 10/30/2020 08/30/2020  Systolic BP 140 143 120  Diastolic BP 85 94 70  Wt. (Lbs) 209.6 209 209  BMI 28.43 28.35 28.35    Physical Exam Constitutional:      General: He is not in acute distress.    Appearance: Normal appearance. He is not ill-appearing.  HENT:     Head: Normocephalic and atraumatic.  Cardiovascular:     Rate and Rhythm: Normal rate and regular rhythm.     Heart sounds: No murmur heard. Pulmonary:     Effort: Pulmonary effort is normal.     Breath sounds: Normal breath sounds. No wheezing, rhonchi or rales.  Neurological:     Mental Status: He is alert.  Psychiatric:        Mood and Affect: Mood normal.        Behavior: Behavior normal.    Lab Results  Component Value Date   WBC 7.3 08/23/2020   HGB 13.8 08/23/2020   HCT 41.4 08/23/2020   PLT 273 08/23/2020   GLUCOSE 93 08/23/2020   CHOL 205 (H) 08/23/2020   TRIG 90 08/23/2020   HDL 50 08/23/2020   LDLCALC 139 (H) 08/23/2020   ALT 19 08/23/2020   AST 17 08/23/2020   NA 141 08/23/2020   K  3.9 08/23/2020   CL 101 08/23/2020   CREATININE 1.12 08/23/2020   BUN 20 08/23/2020   CO2 24 08/23/2020   PSA 3.26 04/13/2014     Assessment & Plan:   Problem List Items Addressed This Visit       Cardiovascular and Mediastinum   Essential hypertension, benign    Stable. 130's/70's at home. Continue indapamide, amlodipine, benazepril, doxazosin.  Refilled today.      Relevant Medications   amLODipine (NORVASC) 5 MG tablet   benazepril (LOTENSIN) 20 MG tablet   doxazosin (CARDURA) 2 MG tablet   indapamide (LOZOL) 2.5 MG tablet     Other   Health care maintenance    Flu shot today. Going to get COVID booster later this month. Had prior shingles vaccine 2011. Wants to wait on Shingrix at this time. Prior PCV 13 and PPSV23. Per ACIP no additional vaccine needed. Can consider PPSV23 every 5-10 years (last one was in 2015).       Hyperlipidemia, mixed    ASCVD 30.9% (10 year risk). High risk. Discussed cholesterol medication.  Patient wants to wait until his labs are redrawn next year.  We will rediscuss at that time.       Relevant Medications   amLODipine (NORVASC) 5 MG tablet   benazepril (LOTENSIN) 20 MG tablet   doxazosin (CARDURA) 2 MG tablet   indapamide (LOZOL) 2.5 MG tablet   Other Visit Diagnoses     Need for vaccination    -  Primary   Relevant Orders   Flu Vaccine QUAD High Dose(Fluad) (Completed)       Follow-up:  Return April, for Follow up Chronic medical issues.  Everlene Other DO [facility]

## 2021-03-05 NOTE — Assessment & Plan Note (Signed)
Flu shot today. Going to get COVID booster later this month. Had prior shingles vaccine 2011. Wants to wait on Shingrix at this time. Prior PCV 13 and PPSV23. Per ACIP no additional vaccine needed. Can consider PPSV23 every 5-10 years (last one was in 2015).

## 2021-03-05 NOTE — Patient Instructions (Signed)
I am refilling all of your meds.  Labs and visit next year (April).  Consider cholesterol medication.  Take care  Dr. Adriana Simas

## 2021-03-05 NOTE — Assessment & Plan Note (Signed)
ASCVD 30.9% (10 year risk). High risk. Discussed cholesterol medication.  Patient wants to wait until his labs are redrawn next year.  We will rediscuss at that time.

## 2021-03-05 NOTE — Assessment & Plan Note (Signed)
Stable. 130's/70's at home. Continue indapamide, amlodipine, benazepril, doxazosin.  Refilled today.

## 2021-07-30 ENCOUNTER — Other Ambulatory Visit (HOSPITAL_COMMUNITY)
Admission: RE | Admit: 2021-07-30 | Discharge: 2021-07-30 | Disposition: A | Discharge status: Home / Self Care | Payer: Medicare Other | Source: Ambulatory Visit | Attending: Family Medicine | Admitting: Family Medicine

## 2021-07-30 ENCOUNTER — Emergency Department (HOSPITAL_COMMUNITY): Payer: Medicare Other | Admitting: Anesthesiology

## 2021-07-30 ENCOUNTER — Ambulatory Visit (INDEPENDENT_AMBULATORY_CARE_PROVIDER_SITE_OTHER): Payer: Medicare Other | Admitting: Family Medicine

## 2021-07-30 ENCOUNTER — Encounter (HOSPITAL_COMMUNITY): Payer: Self-pay | Admitting: *Deleted

## 2021-07-30 ENCOUNTER — Observation Stay (HOSPITAL_COMMUNITY)
Admission: EM | Admit: 2021-07-30 | Discharge: 2021-07-31 | Disposition: A | Discharge status: Home / Self Care | Payer: Medicare Other | Attending: General Surgery | Admitting: General Surgery

## 2021-07-30 ENCOUNTER — Other Ambulatory Visit: Payer: Self-pay

## 2021-07-30 ENCOUNTER — Observation Stay (HOSPITAL_COMMUNITY)
Admission: RE | Admit: 2021-07-30 | Discharge: 2021-07-30 | Disposition: A | Discharge status: Home / Self Care | Payer: Medicare Other | Source: Ambulatory Visit | Attending: Family Medicine | Admitting: Family Medicine

## 2021-07-30 ENCOUNTER — Encounter (HOSPITAL_COMMUNITY)
Admission: EM | Disposition: A | Discharge status: Home / Self Care | Payer: Self-pay | Source: Home / Self Care | Attending: Emergency Medicine

## 2021-07-30 ENCOUNTER — Encounter: Payer: Self-pay | Admitting: Family Medicine

## 2021-07-30 VITALS — BP 136/78 | HR 99 | Temp 97.8°F | Wt 210.0 lb

## 2021-07-30 DIAGNOSIS — Z20822 Contact with and (suspected) exposure to covid-19: Secondary | ICD-10-CM | POA: Insufficient documentation

## 2021-07-30 DIAGNOSIS — K3580 Unspecified acute appendicitis: Secondary | ICD-10-CM | POA: Diagnosis not present

## 2021-07-30 DIAGNOSIS — N400 Enlarged prostate without lower urinary tract symptoms: Secondary | ICD-10-CM | POA: Insufficient documentation

## 2021-07-30 DIAGNOSIS — R1031 Right lower quadrant pain: Secondary | ICD-10-CM | POA: Insufficient documentation

## 2021-07-30 DIAGNOSIS — Z79899 Other long term (current) drug therapy: Secondary | ICD-10-CM | POA: Diagnosis not present

## 2021-07-30 DIAGNOSIS — Z9049 Acquired absence of other specified parts of digestive tract: Secondary | ICD-10-CM

## 2021-07-30 DIAGNOSIS — R9431 Abnormal electrocardiogram [ECG] [EKG]: Secondary | ICD-10-CM | POA: Diagnosis not present

## 2021-07-30 DIAGNOSIS — K3589 Other acute appendicitis without perforation or gangrene: Secondary | ICD-10-CM | POA: Diagnosis not present

## 2021-07-30 DIAGNOSIS — K353 Acute appendicitis with localized peritonitis, without perforation or gangrene: Principal | ICD-10-CM | POA: Insufficient documentation

## 2021-07-30 DIAGNOSIS — I10 Essential (primary) hypertension: Secondary | ICD-10-CM | POA: Diagnosis not present

## 2021-07-30 HISTORY — PX: LAPAROSCOPIC APPENDECTOMY: SHX408

## 2021-07-30 LAB — LIPASE, BLOOD: Lipase: 27 U/L (ref 11–51)

## 2021-07-30 LAB — COMPREHENSIVE METABOLIC PANEL
ALT: 20 U/L (ref 0–44)
AST: 17 U/L (ref 15–41)
Albumin: 4.3 g/dL (ref 3.5–5.0)
Alkaline Phosphatase: 53 U/L (ref 38–126)
Anion gap: 10 (ref 5–15)
BUN: 30 mg/dL — ABNORMAL HIGH (ref 8–23)
CO2: 26 mmol/L (ref 22–32)
Calcium: 9.3 mg/dL (ref 8.9–10.3)
Chloride: 99 mmol/L (ref 98–111)
Creatinine, Ser: 1.07 mg/dL (ref 0.61–1.24)
GFR, Estimated: >60 mL/min (ref >60)
Glucose, Bld: 168 mg/dL — ABNORMAL HIGH (ref 70–99)
Potassium: 3.4 mmol/L — ABNORMAL LOW (ref 3.5–5.1)
Sodium: 135 mmol/L (ref 135–145)
Total Bilirubin: 1.4 mg/dL — ABNORMAL HIGH (ref 0.3–1.2)
Total Protein: 7.7 g/dL (ref 6.5–8.1)

## 2021-07-30 LAB — CBC
HCT: 43 % (ref 39.0–52.0)
Hemoglobin: 14.6 g/dL (ref 13.0–17.0)
MCH: 28.5 pg (ref 26.0–34.0)
MCHC: 34 g/dL (ref 30.0–36.0)
MCV: 84 fL (ref 80.0–100.0)
Platelets: 257 10*3/uL (ref 150–400)
RBC: 5.12 MIL/uL (ref 4.22–5.81)
RDW: 13.5 % (ref 11.5–15.5)
WBC: 16.3 10*3/uL — ABNORMAL HIGH (ref 4.0–10.5)
nRBC: 0 % (ref 0.0–0.2)

## 2021-07-30 LAB — RESP PANEL BY RT-PCR (FLU A&B, COVID) ARPGX2
Influenza A by PCR: NEGATIVE
Influenza B by PCR: NEGATIVE
SARS Coronavirus 2 by RT PCR: NEGATIVE

## 2021-07-30 SURGERY — APPENDECTOMY, LAPAROSCOPIC
Anesthesia: General | Site: Abdomen

## 2021-07-30 MED ORDER — ONDANSETRON HCL 4 MG/2ML IJ SOLN
INTRAMUSCULAR | Status: DC | PRN
Start: 2021-07-30 — End: 2021-07-30
  Administered 2021-07-30: 4 mg via INTRAVENOUS

## 2021-07-30 MED ORDER — HYDROMORPHONE HCL 1 MG/ML IJ SOLN: 1.0000 mg | INTRAMUSCULAR | Status: DC | PRN

## 2021-07-30 MED ORDER — ONDANSETRON HCL 4 MG/2ML IJ SOLN: 4.0000 mg | Freq: Once | INTRAMUSCULAR | Status: DC | PRN

## 2021-07-30 MED ORDER — METRONIDAZOLE 500 MG/100ML IV SOLN
500.0000 mg | Freq: Two times a day (BID) | INTRAVENOUS | Status: DC
  Administered 2021-07-30 – 2021-07-31 (×2): 500 mg via INTRAVENOUS
  Filled 2021-07-30 (×2): qty 100

## 2021-07-30 MED ORDER — ROCURONIUM BROMIDE 10 MG/ML (PF) SYRINGE
PREFILLED_SYRINGE | INTRAVENOUS | Status: AC
  Filled 2021-07-30: qty 10

## 2021-07-30 MED ORDER — CHLORHEXIDINE GLUCONATE 0.12 % MT SOLN: 15.0000 mL | Freq: Once | OROMUCOSAL | Status: DC

## 2021-07-30 MED ORDER — CHLORHEXIDINE GLUCONATE CLOTH 2 % EX PADS: 6.0000 | MEDICATED_PAD | Freq: Once | CUTANEOUS | Status: DC

## 2021-07-30 MED ORDER — HYDROCODONE-ACETAMINOPHEN 5-325 MG PO TABS: 1.0000 | ORAL_TABLET | Freq: Four times a day (QID) | ORAL | Status: DC | PRN

## 2021-07-30 MED ORDER — ORAL CARE MOUTH RINSE: 15.0000 mL | Freq: Once | OROMUCOSAL | Status: DC

## 2021-07-30 MED ORDER — PROPOFOL 10 MG/ML IV BOLUS
INTRAVENOUS | Status: AC
  Filled 2021-07-30: qty 20

## 2021-07-30 MED ORDER — DOXAZOSIN MESYLATE 2 MG PO TABS
2.0000 mg | ORAL_TABLET | Freq: Every day | ORAL | Status: DC
  Filled 2021-07-30: qty 1

## 2021-07-30 MED ORDER — MIDAZOLAM HCL 2 MG/2ML IJ SOLN
INTRAMUSCULAR | Status: AC
  Filled 2021-07-30: qty 2

## 2021-07-30 MED ORDER — SIMETHICONE 80 MG PO CHEW: 40.0000 mg | CHEWABLE_TABLET | Freq: Four times a day (QID) | ORAL | Status: DC | PRN

## 2021-07-30 MED ORDER — PHENYLEPHRINE 40 MCG/ML (10ML) SYRINGE FOR IV PUSH (FOR BLOOD PRESSURE SUPPORT)
PREFILLED_SYRINGE | INTRAVENOUS | Status: AC
  Filled 2021-07-30: qty 10

## 2021-07-30 MED ORDER — LORAZEPAM 2 MG/ML IJ SOLN
1.0000 mg | INTRAMUSCULAR | Status: DC | PRN
Start: 2021-07-30 — End: 2021-07-31

## 2021-07-30 MED ORDER — KETOROLAC TROMETHAMINE 15 MG/ML IJ SOLN: 15.0000 mg | Freq: Four times a day (QID) | INTRAMUSCULAR | Status: AC

## 2021-07-30 MED ORDER — IOHEXOL 9 MG/ML PO SOLN
ORAL | Status: AC
  Filled 2021-07-30: qty 1000

## 2021-07-30 MED ORDER — DIPHENHYDRAMINE HCL 50 MG/ML IJ SOLN: 12.5000 mg | Freq: Four times a day (QID) | INTRAMUSCULAR | Status: DC | PRN

## 2021-07-30 MED ORDER — FENTANYL CITRATE (PF) 100 MCG/2ML IJ SOLN
INTRAMUSCULAR | Status: AC
  Filled 2021-07-30: qty 2

## 2021-07-30 MED ORDER — INDAPAMIDE 2.5 MG PO TABS
2.5000 mg | ORAL_TABLET | Freq: Every day | ORAL | Status: DC
  Administered 2021-07-31: 2.5 mg via ORAL
  Filled 2021-07-30 (×4): qty 1

## 2021-07-30 MED ORDER — FENTANYL CITRATE PF 50 MCG/ML IJ SOSY
25.0000 ug | PREFILLED_SYRINGE | INTRAMUSCULAR | Status: DC | PRN
  Administered 2021-07-30: 50 ug via INTRAVENOUS
  Filled 2021-07-30: qty 1

## 2021-07-30 MED ORDER — BUPIVACAINE LIPOSOME 1.3 % IJ SUSP
INTRAMUSCULAR | Status: DC | PRN
  Administered 2021-07-30: 20 mL

## 2021-07-30 MED ORDER — SODIUM CHLORIDE 0.9 % IV SOLN
2.0000 g | INTRAVENOUS | Status: AC
  Administered 2021-07-30: 2 g via INTRAVENOUS

## 2021-07-30 MED ORDER — SODIUM CHLORIDE 0.9 % IV SOLN: INTRAVENOUS | Status: DC

## 2021-07-30 MED ORDER — FENTANYL CITRATE (PF) 100 MCG/2ML IJ SOLN
INTRAMUSCULAR | Status: DC | PRN
  Administered 2021-07-30 (×2): 100 ug via INTRAVENOUS

## 2021-07-30 MED ORDER — LACTATED RINGERS IV SOLN: INTRAVENOUS | Status: DC

## 2021-07-30 MED ORDER — SUCCINYLCHOLINE CHLORIDE 200 MG/10ML IV SOSY
PREFILLED_SYRINGE | INTRAVENOUS | Status: AC
  Filled 2021-07-30: qty 10

## 2021-07-30 MED ORDER — DIPHENHYDRAMINE HCL 12.5 MG/5ML PO ELIX: 12.5000 mg | ORAL_SOLUTION | Freq: Four times a day (QID) | ORAL | Status: DC | PRN

## 2021-07-30 MED ORDER — PROPOFOL 10 MG/ML IV BOLUS
INTRAVENOUS | Status: DC | PRN
  Administered 2021-07-30: 150 mg via INTRAVENOUS

## 2021-07-30 MED ORDER — SUGAMMADEX SODIUM 500 MG/5ML IV SOLN
INTRAVENOUS | Status: AC
  Filled 2021-07-30: qty 5

## 2021-07-30 MED ORDER — SODIUM CHLORIDE 0.9 % IV SOLN: 2.0000 g | INTRAVENOUS | Status: DC

## 2021-07-30 MED ORDER — SODIUM CHLORIDE 0.9 % IV BOLUS: 500.0000 mL | Freq: Once | INTRAVENOUS | Status: DC

## 2021-07-30 MED ORDER — SUCCINYLCHOLINE CHLORIDE 200 MG/10ML IV SOSY
PREFILLED_SYRINGE | INTRAVENOUS | Status: DC | PRN
  Administered 2021-07-30: 100 mg via INTRAVENOUS

## 2021-07-30 MED ORDER — AMLODIPINE BESYLATE 5 MG PO TABS
5.0000 mg | ORAL_TABLET | Freq: Every day | ORAL | Status: DC
  Administered 2021-07-31: 5 mg via ORAL
  Filled 2021-07-30: qty 1

## 2021-07-30 MED ORDER — BENAZEPRIL HCL 20 MG PO TABS
20.0000 mg | ORAL_TABLET | Freq: Every day | ORAL | Status: DC
  Administered 2021-07-31: 20 mg via ORAL
  Filled 2021-07-30 (×3): qty 1

## 2021-07-30 MED ORDER — MIDAZOLAM HCL 5 MG/5ML IJ SOLN
INTRAMUSCULAR | Status: DC | PRN
  Administered 2021-07-30 (×2): 1 mg via INTRAVENOUS

## 2021-07-30 MED ORDER — ACETAMINOPHEN 650 MG RE SUPP: 650.0000 mg | Freq: Four times a day (QID) | RECTAL | Status: DC | PRN

## 2021-07-30 MED ORDER — KETOROLAC TROMETHAMINE 15 MG/ML IJ SOLN: 15.0000 mg | Freq: Four times a day (QID) | INTRAMUSCULAR | Status: DC | PRN

## 2021-07-30 MED ORDER — ENOXAPARIN SODIUM 40 MG/0.4ML IJ SOSY
40.0000 mg | PREFILLED_SYRINGE | INTRAMUSCULAR | Status: DC
  Administered 2021-07-31: 40 mg via SUBCUTANEOUS
  Filled 2021-07-30: qty 0.4

## 2021-07-30 MED ORDER — CEFTRIAXONE SODIUM 2 G IJ SOLR
INTRAMUSCULAR | Status: AC
  Filled 2021-07-30: qty 20

## 2021-07-30 MED ORDER — SODIUM CHLORIDE 0.9 % IV SOLN: INTRAVENOUS | Status: DC | PRN

## 2021-07-30 MED ORDER — ROCURONIUM BROMIDE 100 MG/10ML IV SOLN
INTRAVENOUS | Status: DC | PRN
  Administered 2021-07-30: 20 mg via INTRAVENOUS
  Administered 2021-07-30: 40 mg via INTRAVENOUS

## 2021-07-30 MED ORDER — ONDANSETRON HCL 4 MG/2ML IJ SOLN: 4.0000 mg | Freq: Four times a day (QID) | INTRAMUSCULAR | Status: DC | PRN

## 2021-07-30 MED ORDER — SODIUM CHLORIDE 0.9 % IR SOLN
Status: DC | PRN
  Administered 2021-07-30: 1000 mL

## 2021-07-30 MED ORDER — SUGAMMADEX SODIUM 200 MG/2ML IV SOLN
INTRAVENOUS | Status: DC | PRN
  Administered 2021-07-30: 200 mg via INTRAVENOUS

## 2021-07-30 MED ORDER — ONDANSETRON 4 MG PO TBDP: 4.0000 mg | ORAL_TABLET | Freq: Four times a day (QID) | ORAL | Status: DC | PRN

## 2021-07-30 MED ORDER — PHENYLEPHRINE HCL (PRESSORS) 10 MG/ML IV SOLN
INTRAVENOUS | Status: DC | PRN
  Administered 2021-07-30: 80 ug via INTRAVENOUS

## 2021-07-30 MED ORDER — ACETAMINOPHEN 325 MG PO TABS: 650.0000 mg | ORAL_TABLET | Freq: Four times a day (QID) | ORAL | Status: DC | PRN

## 2021-07-30 MED ORDER — ONDANSETRON HCL 4 MG/2ML IJ SOLN
INTRAMUSCULAR | Status: AC
  Filled 2021-07-30: qty 2

## 2021-07-30 SURGICAL SUPPLY — 52 items
ADH SKN CLS APL DERMABOND .7 (GAUZE/BANDAGES/DRESSINGS) ×1
APL PRP STRL LF DISP 70% ISPRP (MISCELLANEOUS) ×1
BAG RETRIEVAL 10 (BASKET) ×1
CHLORAPREP W/TINT 26 (MISCELLANEOUS) ×2 IMPLANT
CLOTH BEACON ORANGE TIMEOUT ST (SAFETY) ×2 IMPLANT
COVER LIGHT HANDLE STERIS (MISCELLANEOUS) ×4 IMPLANT
CUTTER FLEX LINEAR 45M (STAPLE) ×2 IMPLANT
DERMABOND ADVANCED (GAUZE/BANDAGES/DRESSINGS) ×1
DERMABOND ADVANCED .7 DNX12 (GAUZE/BANDAGES/DRESSINGS) ×1 IMPLANT
ELECT REM PT RETURN 9FT ADLT (ELECTROSURGICAL) ×2
ELECTRODE REM PT RTRN 9FT ADLT (ELECTROSURGICAL) ×1 IMPLANT
GAUZE 4X4 16PLY ~~LOC~~+RFID DBL (SPONGE) ×2 IMPLANT
GLOVE SURG POLYISO LF SZ7 (GLOVE) ×1 IMPLANT
GLOVE SURG POLYISO LF SZ7.5 (GLOVE) ×2 IMPLANT
GLOVE SURG POLYISO LF SZ8.5 (GLOVE) ×1 IMPLANT
GLOVE SURG UNDER POLY LF SZ7 (GLOVE) ×6 IMPLANT
GLOVE SURG UNDER POLY LF SZ8.5 (GLOVE) ×1 IMPLANT
GOWN STRL REUS W/TWL LRG LVL3 (GOWN DISPOSABLE) ×4 IMPLANT
GOWN STRL REUS W/TWL XL LVL3 (GOWN DISPOSABLE) ×1 IMPLANT
INST SET LAPROSCOPIC AP (KITS) ×2 IMPLANT
KIT BLADEGUARD II DBL (SET/KITS/TRAYS/PACK) ×1 IMPLANT
KIT TURNOVER KIT A (KITS) ×2 IMPLANT
MANIFOLD NEPTUNE II (INSTRUMENTS) ×2 IMPLANT
NDL HYPO 18GX1.5 BLUNT FILL (NEEDLE) ×1 IMPLANT
NDL HYPO 21X1.5 SAFETY (NEEDLE) ×1 IMPLANT
NDL INSUFFLATION 14GA 120MM (NEEDLE) ×1 IMPLANT
NEEDLE HYPO 18GX1.5 BLUNT FILL (NEEDLE) ×2 IMPLANT
NEEDLE HYPO 21X1.5 SAFETY (NEEDLE) ×2 IMPLANT
NEEDLE INSUFFLATION 14GA 120MM (NEEDLE) ×2 IMPLANT
NS IRRIG 1000ML POUR BTL (IV SOLUTION) ×2 IMPLANT
PACK LAP CHOLE LZT030E (CUSTOM PROCEDURE TRAY) ×2 IMPLANT
PAD ARMBOARD 7.5X6 YLW CONV (MISCELLANEOUS) ×2 IMPLANT
PENCIL SMOKE EVACUATOR (MISCELLANEOUS) ×1 IMPLANT
RELOAD STAPLE 45 3.5 BLU ETS (ENDOMECHANICALS) IMPLANT
RELOAD STAPLE TA45 3.5 REG BLU (ENDOMECHANICALS) ×2 IMPLANT
SET BASIN LINEN APH (SET/KITS/TRAYS/PACK) ×2 IMPLANT
SET TUBE IRRIG SUCTION NO TIP (IRRIGATION / IRRIGATOR) ×2 IMPLANT
SET TUBE SMOKE EVAC HIGH FLOW (TUBING) ×2 IMPLANT
SHEARS HARMONIC ACE PLUS 36CM (ENDOMECHANICALS) ×2 IMPLANT
SUT MNCRL AB 4-0 PS2 18 (SUTURE) ×4 IMPLANT
SUT VICRYL 0 UR6 27IN ABS (SUTURE) ×2 IMPLANT
SYR 20ML LL LF (SYRINGE) ×3 IMPLANT
SYS BAG RETRIEVAL 10MM (BASKET) ×1
SYSTEM BAG RETRIEVAL 10MM (BASKET) ×1 IMPLANT
TRAY FOLEY W/BAG SLVR 16FR (SET/KITS/TRAYS/PACK) ×2
TRAY FOLEY W/BAG SLVR 16FR ST (SET/KITS/TRAYS/PACK) ×1 IMPLANT
TROCAR XCEL NON-BLD 5MMX100MML (ENDOMECHANICALS) ×1 IMPLANT
TROCAR Z-THAD FIOS HNDL 12X100 (TROCAR) ×1 IMPLANT
TROCAR Z-THRD FIOS HNDL 11X100 (TROCAR) ×1 IMPLANT
TROCAR Z-THREAD FIOS 5X100MM (TROCAR) ×1 IMPLANT
TUBE CONNECTING 12X1/4 (SUCTIONS) ×2 IMPLANT
WARMER LAPAROSCOPE (MISCELLANEOUS) ×2 IMPLANT

## 2021-07-30 NOTE — Transfer of Care (Signed)
Immediate Anesthesia Transfer of Care Note ? ?Patient: Richard Becker ? ?Procedure(s) Performed: APPENDECTOMY LAPAROSCOPIC (Abdomen) ? ?Patient Location: PACU ? ?Anesthesia Type:General ? ?Level of Consciousness: awake, alert  and oriented ? ?Airway & Oxygen Therapy: Patient Spontanous Breathing ? ?Post-op Assessment: Report given to RN and Post -op Vital signs reviewed and stable ? ?Post vital signs: Reviewed and stable ? ?Last Vitals:  ?Vitals Value Taken Time  ?BP    ?Temp    ?Pulse 95 07/30/21 1629  ?Resp    ?SpO2 93 % 07/30/21 1629  ?Vitals shown include unvalidated device data. ? ?Last Pain:  ?Vitals:  ? 07/30/21 1443  ?TempSrc:   ?PainSc: 0-No pain  ?   ? ?  ? ?Complications: No notable events documented. ?

## 2021-07-30 NOTE — H&P (Signed)
Richard Becker is an 76 y.o. male.   ?Chief Complaint: Acute appendicitis ?HPI: Patient is a 76yo wm who presented to his primary care physician with worsening right lower quadrant abdominal pain over the past 24 hours.  CT scan of the abdomen confirms acute appendicitis. ? ?Past Medical History:  ?Diagnosis Date  ? Arthritis   ? Colon polyp   ? Hypertension   ? IFG (impaired fasting glucose)   ? ? ?Past Surgical History:  ?Procedure Laterality Date  ? Bilateral hand surgery    ? COLONOSCOPY    ? COLONOSCOPY N/A 07/20/2014  ? Procedure: COLONOSCOPY;  Surgeon: Rogene Houston, MD;  Location: [facility];  Service: Endoscopy;  Laterality: N/A;  830  ? ? ?Family History  ?Problem Relation Age of Onset  ? Cancer Mother   ?     Breast  ? Heart attack Father   ? ?Social History:  reports that he has never smoked. He has never used smokeless tobacco. He reports that he does not drink alcohol and does not use drugs. ? ?Allergies: No Known Allergies ? ?Medications Prior to Admission  ?Medication Sig Dispense Refill  ? amLODipine (NORVASC) 5 MG tablet TAKE ONE TABLET BY MOUTH ONCE DAILY 90 tablet 1  ? benazepril (LOTENSIN) 20 MG tablet TAKE ONE TABLET BY MOUTH ONCE DAILY 90 tablet 1  ? doxazosin (CARDURA) 2 MG tablet Take 1 tablet (2 mg total) by mouth daily. 90 tablet 1  ? Garlic [id number] MG CAPS Take 600 mg by mouth.    ? indapamide (LOZOL) 2.5 MG tablet Take 1 tablet (2.5 mg total) by mouth daily. 90 tablet 1  ? ? ?Results for orders placed or performed during the hospital encounter of 07/30/21 (from the past 48 hour(s))  ?CBC     Status: Abnormal  ? Collection Time: 07/30/21 10:14 AM  ?Result Value Ref Range  ? WBC 16.3 (H) 4.0 - 10.5 K/uL  ? RBC 5.12 4.22 - 5.81 MIL/uL  ? Hemoglobin 14.6 13.0 - 17.0 g/dL  ? HCT 43.0 39.0 - 52.0 %  ? MCV 84.0 80.0 - 100.0 fL  ? MCH 28.5 26.0 - 34.0 pg  ? MCHC 34.0 30.0 - 36.0 g/dL  ? RDW 13.5 11.5 - 15.5 %  ? Platelets 257 150 - 400 K/uL  ? nRBC 0.0 0.0 - 0.2 %  ?  Comment: Performed at  [facility], [address], [place], [place] [address]  ?Lipase, blood     Status: None  ? Collection Time: 07/30/21 10:14 AM  ?Result Value Ref Range  ? Lipase 27 11 - 51 U/L  ?  Comment: Performed at [facility], [address], [place], [place] [address]  ?Comprehensive metabolic panel     Status: Abnormal  ? Collection Time: 07/30/21 10:14 AM  ?Result Value Ref Range  ? Sodium 135 135 - 145 mmol/L  ? Potassium 3.4 (L) 3.5 - 5.1 mmol/L  ? Chloride 99 98 - 111 mmol/L  ? CO2 26 22 - 32 mmol/L  ? Glucose, Bld 168 (H) 70 - 99 mg/dL  ?  Comment: Glucose reference range applies only to samples taken after fasting for at least 8 hours.  ? BUN 30 (H) 8 - 23 mg/dL  ? Creatinine, Ser 1.07 0.61 - 1.24 mg/dL  ? Calcium 9.3 8.9 - 10.3 mg/dL  ? Total Protein 7.7 6.5 - 8.1 g/dL  ? Albumin 4.3 3.5 - 5.0 g/dL  ? AST 17 15 - 41 U/L  ?  ALT 20 0 - 44 U/L  ? Alkaline Phosphatase 53 38 - 126 U/L  ? Total Bilirubin 1.4 (H) 0.3 - 1.2 mg/dL  ? GFR, Estimated >60 >60 mL/min  ?  Comment: (NOTE) ?Calculated using the CKD-EPI Creatinine Equation (2021) ?  ? Anion gap 10 5 - 15  ?  Comment: Performed at [facility], [address] [place], [place] [address]  ? ?CT Abdomen Pelvis Wo Contrast ? ?Addendum Date: 07/30/2021   ?ADDENDUM REPORT: 07/30/2021 13:15 ADDENDUM: These results were called to the ordering clinician or representative by the Radiologist Assistant, and communication documented in the PACS or [employer]. Electronically Signed   By: Elmer Picker M.D.   On: 07/30/2021 13:15  ? ?Result Date: 07/30/2021 ?CLINICAL DATA:  Pain right lower quadrant EXAM: CT ABDOMEN AND PELVIS WITHOUT CONTRAST TECHNIQUE: Multidetector CT imaging of the abdomen and pelvis was performed following the standard protocol without IV contrast. RADIATION DOSE REDUCTION: This exam was performed according to the departmental dose-optimization program which includes automated exposure control, adjustment of the mA and/or kV according to patient size  and/or use of iterative reconstruction technique. COMPARISON:  None. FINDINGS: Lower chest: There is no focal pulmonary consolidation. There are few pleural-based nodules in the periphery of right lower lobe each measuring less than 7 mm. Hepatobiliary: No focal abnormality is seen in the liver. Gallbladder is unremarkable. Pancreas: Pancreas is smaller than usual in size. No focal abnormality is seen. Spleen: Unremarkable. Adrenals/Urinary Tract: Adrenals are unremarkable. There is no hydronephrosis. There are no renal or ureteral stones. Urinary bladder is unremarkable. Stomach/Bowel: Stomach is unremarkable. Small hiatal hernia is seen. Small bowel loops are not dilated. There is abnormal dilation of appendix measuring up to 12 mm in diameter. There is wall thickening in the appendix. There is significant pericecal stranding. There is no loculated pericecal fluid collection. There is no significant wall thickening in colon. Vascular/Lymphatic: Scattered arterial calcifications are seen. Reproductive: Prostate is enlarged. Other: There is no ascites or pneumoperitoneum. Umbilical hernia containing fat is seen. Bilateral inguinal hernias containing fat are seen. Musculoskeletal: Degenerative changes are noted at the L5-S1 level with significant encroachment of neural foramina. There is mild spinal stenosis and encroachment of neural foramina at L4-L5 level. IMPRESSION: Severe acute appendicitis.  There is no loculated pericecal abscess. There is no evidence of intestinal obstruction or pneumoperitoneum. There is no hydronephrosis. Fatty liver.  Lumbar spondylosis.  Enlarged prostate. Other findings as described in the body of the report. Electronically Signed: By: Elmer Picker M.D. On: 07/30/2021 13:07   ? ?Review of Systems  ?Constitutional:  Positive for activity change, appetite change and fatigue.  ?HENT: Negative.    ?Eyes: Negative.   ?Respiratory: Negative.    ?Cardiovascular: Negative.    ?Gastrointestinal:  Positive for abdominal pain.  ?Endocrine: Negative.   ?Genitourinary: Negative.   ?Musculoskeletal: Negative.   ?Allergic/Immunologic: Negative.   ?Neurological: Negative.   ?Hematological: Negative.   ?Psychiatric/Behavioral: Negative.    ? ?Blood pressure 129/76, pulse 95, temperature 98.2 ?F (36.8 ?C), temperature source Oral, resp. rate 17, height 6' (1.829 m), weight 95.3 kg, SpO2 97 %. ?Physical Exam ?Vitals reviewed.  ?Constitutional:   ?   Appearance: He is well-developed. He is not ill-appearing.  ?HENT:  ?   Head: Normocephalic and atraumatic.  ?Pulmonary:  ?   Effort: Pulmonary effort is normal. No respiratory distress.  ?   Breath sounds: Normal breath sounds. No stridor. No wheezing, rhonchi or rales.  ?Abdominal:  ?  General: Bowel sounds are normal. There is no distension.  ?   Palpations: Abdomen is soft.  ?   Tenderness: There is abdominal tenderness in the right lower quadrant. Positive signs include McBurney's sign.  ?   Hernia: No hernia is present.  ?Skin: ?   General: Skin is warm and dry.  ?Neurological:  ?   Mental Status: He is alert and oriented to person, place, and time.  ?  ? ?Assessment/Plan ?Acute appendicitis ?Plan:  Patient to go to OR for laparoscopic appendectomy.  The risks and benefits of the procedure including bleeding, infection, and the possibility of an open procedure were fully explained to the patient, who gives informed consent. ? ?Aviva Signs, MD ?07/30/2021, 3:10 PM ? ? ? ?

## 2021-07-30 NOTE — ED Triage Notes (Signed)
Right lower quadrant pain, CT SCAN POSITIVE FOR APPENDICITUS ?

## 2021-07-30 NOTE — Interval H&P Note (Signed)
History and Physical Interval Note: ? ?07/30/2021 ?3:14 PM ? ?Richard Becker  has presented today for surgery, with the diagnosis of acute appendicitis.  The various methods of treatment have been discussed with the patient and family. After consideration of risks, benefits and other options for treatment, the patient has consented to  Procedure(s): ?APPENDECTOMY LAPAROSCOPIC (N/A) as a surgical intervention.  The patient's history has been reviewed, patient examined, no change in status, stable for surgery.  I have reviewed the patient's chart and labs.  Questions were answered to the patient's satisfaction.   ? ? ?[name] ? ? ?

## 2021-07-30 NOTE — Anesthesia Postprocedure Evaluation (Signed)
Anesthesia Post Note ? ?Patient: Richard Becker ? ?Procedure(s) Performed: APPENDECTOMY LAPAROSCOPIC (Abdomen) ? ?Patient location during evaluation: PACU ?Anesthesia Type: General ?Level of consciousness: awake and alert ?Pain management: pain level controlled ?Vital Signs Assessment: post-procedure vital signs reviewed and stable ?Respiratory status: spontaneous breathing, nonlabored ventilation, respiratory function stable and patient connected to nasal cannula oxygen ?Cardiovascular status: blood pressure returned to baseline and stable ?Postop Assessment: no apparent nausea or vomiting ?Anesthetic complications: no ? ? ?No notable events documented. ? ? ?Last Vitals:  ?Vitals:  ? 07/30/21 1352  ?BP: 129/76  ?Pulse: 95  ?Resp: 17  ?Temp: 36.8 ?C  ?SpO2: 97%  ?  ?Last Pain:  ?Vitals:  ? 07/30/21 1443  ?TempSrc:   ?PainSc: 0-No pain  ? ? ?  ?  ?  ?  ?  ?  ? ?Louann Sjogren ? ? ? ? ?

## 2021-07-30 NOTE — ED Provider Notes (Signed)
?[facility] DEPARTMENT ?Provider Note ? ? ?CSN: [id number] ?Arrival date & time: 07/30/21  1329 ? ?  ? ?History ? ?Chief Complaint  ?Patient presents with  ? Abdominal Pain  ? ? ?Richard Becker is a 76 y.o. male. ? ?Patient with abdominal pain.  He had a CT of the abdomen that showed appendicitis.  His primary care doctor sent him to the emergency department to be taken care of, ? ?The history is provided by the patient and medical records. No language interpreter was used.  ?Abdominal Pain ?Pain location:  Generalized ?Pain quality: aching   ?Pain radiates to:  Does not radiate ?Pain severity:  Moderate ?Onset quality:  Sudden ?Timing:  Constant ?Progression:  Waxing and waning ?Associated symptoms: no chest pain, no cough, no diarrhea, no fatigue and no hematuria   ? ?  ? ?Home Medications ?Prior to Admission medications   ?Medication Sig Start Date End Date Taking? Authorizing Provider  ?amLODipine (NORVASC) 5 MG tablet TAKE ONE TABLET BY MOUTH ONCE DAILY 03/05/21   Everlene Other G, DO  ?benazepril (LOTENSIN) 20 MG tablet TAKE ONE TABLET BY MOUTH ONCE DAILY 03/05/21   Tommie Sams, DO  ?doxazosin (CARDURA) 2 MG tablet Take 1 tablet (2 mg total) by mouth daily. 03/05/21   Tommie Sams, DO  ?Garlic 300 MG CAPS Take 600 mg by mouth.    [provider]  ?indapamide (LOZOL) 2.5 MG tablet Take 1 tablet (2.5 mg total) by mouth daily. 03/05/21   Tommie Sams, DO  ?   ? ?Allergies    ?Patient has no known allergies.   ? ?Review of Systems   ?Review of Systems  ?Constitutional:  Negative for appetite change and fatigue.  ?HENT:  Negative for congestion, ear discharge and sinus pressure.   ?Eyes:  Negative for discharge.  ?Respiratory:  Negative for cough.   ?Cardiovascular:  Negative for chest pain.  ?Gastrointestinal:  Positive for abdominal pain. Negative for diarrhea.  ?Genitourinary:  Negative for frequency and hematuria.  ?Musculoskeletal:  Negative for back pain.  ?Skin:  Negative for rash.   ?Neurological:  Negative for seizures and headaches.  ?Psychiatric/Behavioral:  Negative for hallucinations.   ? ?Physical Exam ?Updated Vital Signs ?BP 129/76   Pulse 95   Temp 98.2 ?F (36.8 ?C) (Oral)   Resp 17   Ht 6' (1.829 m)   Wt 95.3 kg   SpO2 97%   BMI 28.48 kg/m?  ?Physical Exam ?Constitutional:   ?   Appearance: Normal appearance. He is well-developed.  ?HENT:  ?   Head: Normocephalic.  ?   Nose: Nose normal.  ?   Mouth/Throat:  ?   Mouth: Mucous membranes are moist.  ?Neck:  ?   Thyroid: No thyromegaly.  ?Cardiovascular:  ?   Rate and Rhythm: Normal rate and regular rhythm.  ?Pulmonary:  ?   Breath sounds: No stridor. No wheezing or rales.  ?Chest:  ?   Chest wall: No tenderness.  ?Abdominal:  ?   General: There is no distension.  ?   Tenderness: There is no abdominal tenderness. There is no rebound.  ?Musculoskeletal:     ?   General: Normal range of motion.  ?Lymphadenopathy:  ?   Cervical: No cervical adenopathy.  ?Skin: ?   Findings: No erythema or rash.  ?Neurological:  ?   Mental Status: He is alert and oriented to person, place, and time.  ?   Motor: No abnormal muscle tone.  ?  Coordination: Coordination normal.  ?Psychiatric:     ?   Behavior: Behavior normal.  ? ? ?ED Results / Procedures / Treatments   ?Labs ?(all labs ordered are listed, but only abnormal results are displayed) ?Labs Reviewed  ?CBC WITH DIFFERENTIAL/PLATELET  ?COMPREHENSIVE METABOLIC PANEL  ? ? ?EKG ?None ? ?Radiology ?CT Abdomen Pelvis Wo Contrast ? ?Addendum Date: 07/30/2021   ?ADDENDUM REPORT: 07/30/2021 13:15 ADDENDUM: These results were called to the ordering clinician or representative by the Radiologist Assistant, and communication documented in the PACS or [employer]. Electronically Signed   By: Ernie Avena M.D.   On: 07/30/2021 13:15  ? ?Result Date: 07/30/2021 ?CLINICAL DATA:  Pain right lower quadrant EXAM: CT ABDOMEN AND PELVIS WITHOUT CONTRAST TECHNIQUE: Multidetector CT imaging of the abdomen  and pelvis was performed following the standard protocol without IV contrast. RADIATION DOSE REDUCTION: This exam was performed according to the departmental dose-optimization program which includes automated exposure control, adjustment of the mA and/or kV according to patient size and/or use of iterative reconstruction technique. COMPARISON:  None. FINDINGS: Lower chest: There is no focal pulmonary consolidation. There are few pleural-based nodules in the periphery of right lower lobe each measuring less than 7 mm. Hepatobiliary: No focal abnormality is seen in the liver. Gallbladder is unremarkable. Pancreas: Pancreas is smaller than usual in size. No focal abnormality is seen. Spleen: Unremarkable. Adrenals/Urinary Tract: Adrenals are unremarkable. There is no hydronephrosis. There are no renal or ureteral stones. Urinary bladder is unremarkable. Stomach/Bowel: Stomach is unremarkable. Small hiatal hernia is seen. Small bowel loops are not dilated. There is abnormal dilation of appendix measuring up to 12 mm in diameter. There is wall thickening in the appendix. There is significant pericecal stranding. There is no loculated pericecal fluid collection. There is no significant wall thickening in colon. Vascular/Lymphatic: Scattered arterial calcifications are seen. Reproductive: Prostate is enlarged. Other: There is no ascites or pneumoperitoneum. Umbilical hernia containing fat is seen. Bilateral inguinal hernias containing fat are seen. Musculoskeletal: Degenerative changes are noted at the L5-S1 level with significant encroachment of neural foramina. There is mild spinal stenosis and encroachment of neural foramina at L4-L5 level. IMPRESSION: Severe acute appendicitis.  There is no loculated pericecal abscess. There is no evidence of intestinal obstruction or pneumoperitoneum. There is no hydronephrosis. Fatty liver.  Lumbar spondylosis.  Enlarged prostate. Other findings as described in the body of the  report. Electronically Signed: By: Ernie Avena M.D. On: 07/30/2021 13:07   ? ?Procedures ?Procedures  ? ? ?Medications Ordered in ED ?Medications  ?sodium chloride 0.9 % bolus 500 mL (has no administration in time range)  ? ? ?ED Course/ Medical Decision Making/ A&P ?  ?                        ?Medical Decision Making ?Amount and/or Complexity of Data Reviewed ?Labs: ordered. ? ?Risk ?Decision regarding hospitalization. ? ?Patient with appendicitis.  I have contacted Dr. Lovell Sheehan general surgery and he is going to see the patient and most likely take him to surgery today ? ? ? ? ? ? ? ?Final Clinical Impression(s) / ED Diagnoses ?Final diagnoses:  ?None  ? ? ?Rx / DC Orders ?ED Discharge Orders   ? ? None  ? ?  ? ? ?  ?Bethann Berkshire, MD ?07/30/21 1816 ? ?

## 2021-07-30 NOTE — Assessment & Plan Note (Signed)
Patient presents with right lower quadrant pain.  Concern for appendicitis.  Laboratory studies revealed leukocytosis.  Mild hypokalemia.  CT revealed acute appendicitis.  Patient is going directly to the ER.  I have spoken with the ER physician.  Needs hospital admission. ?

## 2021-07-30 NOTE — Progress Notes (Signed)
? ?Subjective:  ?Patient ID: Richard Becker, male    DOB: 25-Jun-1945  Age: 76 y.o. MRN: [id number] ? ?CC: ?Chief Complaint  ?Patient presents with  ? Flank Pain  ?  Dull aches on RLQ. Pt states yesterday he felt a sharp pain in right side and decreased appetite. Dry heaves this morning.   ? ? ?HPI: ? ?76 year old male with hypertension, BPH, hyperlipidemia presents with complaints of right lower quadrant pain. ? ?Started yesterday afternoon.  He has had some associated nausea.  No vomiting.  Pain initially severe and sharp.  Pain has slightly improved but is still present and is described as achy.  He has not eaten since yesterday morning.  He reports little to no appetite.  No documented fever.  No reports of diarrhea or constipation.  He does not feel bloated.  No relieving factors.  Has a history of kidney stones.  Has never had anything like this before. ? ?Patient Active Problem List  ? Diagnosis Date Noted  ? BPH (benign prostatic hyperplasia) 07/30/2021  ? Right lower quadrant abdominal pain 07/30/2021  ? Health care maintenance 03/05/2021  ? Essential hypertension, benign 09/21/2012  ? Hyperlipidemia, mixed 09/21/2012  ? ? ?Social Hx   ?Social History  ? ?Socioeconomic History  ? Marital status: Single  ?  Spouse name: Not on file  ? Number of children: Not on file  ? Years of education: Not on file  ? Highest education level: Not on file  ?Occupational History  ? Not on file  ?Tobacco Use  ? Smoking status: Never  ? Smokeless tobacco: Never  ?Substance and Sexual Activity  ? Alcohol use: No  ? Drug use: No  ? Sexual activity: Not on file  ?Other Topics Concern  ? Not on file  ?Social History Narrative  ? Not on file  ? ?Social Determinants of Health  ? ?Financial Resource Strain: Not on file  ?Food Insecurity: Not on file  ?Transportation Needs: Not on file  ?Physical Activity: Not on file  ?Stress: Not on file  ?Social Connections: Not on file  ? ? ?Review of Systems ?Per HPI ? ?Objective:  ?BP 136/78    Pulse 99   Temp 97.8 ?F (36.6 ?C)   Wt 210 lb (95.3 kg)   SpO2 97%   BMI 28.48 kg/m?  ? ?BP/Weight 07/30/2021 03/05/2021 10/30/2020  ?Systolic BP 136 140 143  ?Diastolic BP 78 85 94  ?Wt. (Lbs) 210 209.6 209  ?BMI 28.48 28.43 28.35  ? ? ?Physical Exam ?Constitutional:   ?   General: He is not in acute distress. ?   Appearance: Normal appearance.  ?HENT:  ?   Head: Normocephalic and atraumatic.  ?Cardiovascular:  ?   Rate and Rhythm: Normal rate and regular rhythm.  ?Pulmonary:  ?   Effort: Pulmonary effort is normal.  ?   Breath sounds: Normal breath sounds. No wheezing, rhonchi or rales.  ?Abdominal:  ?   Palpations: Abdomen is soft.  ?   Comments: Soft.  Protuberant but does not appear to be distended. ?Tender to palpation in the right lower quadrant.  No rebound.  No guarding.  ?Neurological:  ?   Mental Status: He is alert.  ?Psychiatric:     ?   Mood and Affect: Mood normal.     ?   Behavior: Behavior normal.  ? ? ?Lab Results  ?Component Value Date  ? WBC 16.3 (H) 07/30/2021  ? HGB 14.6 07/30/2021  ? HCT 43.0 07/30/2021  ?  PLT 257 07/30/2021  ? GLUCOSE 168 (H) 07/30/2021  ? CHOL 205 (H) 08/23/2020  ? TRIG 90 08/23/2020  ? HDL 50 08/23/2020  ? LDLCALC 139 (H) 08/23/2020  ? ALT 20 07/30/2021  ? AST 17 07/30/2021  ? NA 135 07/30/2021  ? K 3.4 (L) 07/30/2021  ? CL 99 07/30/2021  ? CREATININE 1.07 07/30/2021  ? BUN 30 (H) 07/30/2021  ? CO2 26 07/30/2021  ? PSA 3.26 04/13/2014  ? ? ? ?Assessment & Plan:  ? ?Problem List Items Addressed This Visit   ? ?  ? Other  ? Right lower quadrant abdominal pain - Primary  ?  Patient presents with right lower quadrant pain.  Concern for appendicitis.  Laboratory studies revealed leukocytosis.  Mild hypokalemia.  CT revealed acute appendicitis.  Patient is going directly to the ER.  I have spoken with the ER physician.  Needs hospital admission. ?  ?  ? Relevant Orders  ? CBC  ? Comprehensive metabolic panel  ? Lipase  ? CT Abdomen Pelvis Wo Contrast (Completed)  ? ? ?Everlene Other  DO ?[facility] ? ?

## 2021-07-30 NOTE — Anesthesia Procedure Notes (Signed)
Procedure Name: Intubation ?Date/Time: 07/30/2021 3:24 PM ?Performed by: Louann Sjogren, MD ?Pre-anesthesia Checklist: Patient identified, Emergency Drugs available, Suction available and Patient being monitored ?Patient Re-evaluated:Patient Re-evaluated prior to induction ?Oxygen Delivery Method: Circle system utilized ?Preoxygenation: Pre-oxygenation with 100% oxygen ?Induction Type: IV induction ?Ventilation: Mask ventilation without difficulty ?Laryngoscope Size: Mac and 3 ?Grade View: Grade II ?Tube type: Oral ?Tube size: 7.5 mm ?Number of attempts: 1 ?Airway Equipment and Method: Stylet ?Placement Confirmation: ETT inserted through vocal cords under direct vision, positive ETCO2 and breath sounds checked- equal and bilateral ?Tube secured with: Tape ?Dental Injury: Teeth and Oropharynx as per pre-operative assessment  ? ? ? ? ?

## 2021-07-30 NOTE — Anesthesia Preprocedure Evaluation (Addendum)
Anesthesia Evaluation  ?Patient identified by MRN, date of birth, ID band ?Patient awake ? ? ? ?Reviewed: ?Allergy & Precautions, H&P , NPO status , Patient's Chart, lab work & pertinent test results, reviewed documented beta blocker date and time  ? ?Airway ?Mallampati: I ? ?TM Distance: >3 FB ?Neck ROM: full ? ? ? Dental ?no notable dental hx. ? ?  ?Pulmonary ?neg pulmonary ROS,  ?  ?Pulmonary exam normal ?breath sounds clear to auscultation ? ? ? ? ? ? Cardiovascular ?Exercise Tolerance: Good ?hypertension, negative cardio ROS ? ? ?Rhythm:regular Rate:Normal ? ? ?  ?Neuro/Psych ?negative neurological ROS ? negative psych ROS  ? GI/Hepatic ?negative GI ROS, Neg liver ROS,   ?Endo/Other  ?negative endocrine ROS ? Renal/GU ?negative Renal ROS  ?negative genitourinary ?  ?Musculoskeletal ? ? Abdominal ?  ?Peds ? Hematology ?negative hematology ROS ?(+)   ?Anesthesia Other Findings ? ? Reproductive/Obstetrics ?negative OB ROS ? ?  ? ? ? ? ? ? ? ? ? ? ? ? ? ?  ?  ? ? ? ? ? ? ? ?Anesthesia Physical ?Anesthesia Plan ? ?ASA: 2 and emergent ? ?Anesthesia Plan: General and General ETT  ? ?Post-op Pain Management:   ? ?Induction:  ? ?PONV Risk Score and Plan: Ondansetron ? ?Airway Management Planned:  ? ?Additional Equipment:  ? ?Intra-op Plan:  ? ?Post-operative Plan:  ? ?Informed Consent: I have reviewed the patients History and Physical, chart, labs and discussed the procedure including the risks, benefits and alternatives for the proposed anesthesia with the patient or authorized representative who has indicated his/her understanding and acceptance.  ? ? ? ?Dental Advisory Given ? ?Plan Discussed with: CRNA ? ?Anesthesia Plan Comments:   ? ? ? ? ? ?Anesthesia Quick Evaluation ? ?

## 2021-07-30 NOTE — Op Note (Signed)
Patient:  Richard Becker ? ?DOB:  1946-04-06 ? ?MRN:  [id number] ? ? ?Preop Diagnosis: Acute appendicitis ? ?Postop Diagnosis: Same ? ?Procedure: Laparoscopic appendectomy ? ?Surgeon: [name], MD ? ?Anes: General endotracheal ? ?Indications: Patient is a 76 year old white male who presents with a 24-hour history of worsening right lower quadrant abdominal pain.  CT scan of the abdomen reveals acute appendicitis.  The risks and benefits of the procedure including bleeding, infection, and the possibility of an open procedure were fully explained to the patient, who gave informed consent. ? ?Procedure note: The patient was placed in the supine position.  After induction of general endotracheal anesthesia, the abdomen was prepped and draped using the usual sterile technique with ChloraPrep.  Surgical site confirmation was performed. ? ?A supraumbilical incision was made down to the fascia.  A Veress needle was introduced into the abdominal cavity and confirmation of placement was done using the saline drop test.  The abdomen was then insufflated to 15 mmHg pressure.  An 11 mm trocar was introduced into the abdominal cavity under direct visualization without difficulty.  The patient was placed in deeper Trendelenburg position and an additional 12 mm trocar was placed in the suprapubic region and a 5 mm trocar was placed in the left lower quadrant region.  The appendix was visualized and noted to be somewhat retrocecal in nature with the tip extending down along the right paracolic gutter.  In order to facilitate the dissection, an additional 5 mm trocar was placed in the right lower quadrant.  The mesoappendix was divided using the harmonic scalpel.  The midportion of the appendix did fall apart due to its necrotic nature.  I was able to carry the dissection down to the junction of the appendix to the cecum.  A standard Endo GIA was placed across the base of the appendix and fired.  Minimal spillage of stool was  noted.  All the appendiceal remnants were removed using an Endo Catch bag without difficulty.  The staple line was inspected and noted to be within normal limits.  All fluid and air were then evacuated from the abdominal cavity prior to the removal of the trocars. ? ?All wounds were irrigated with normal saline.  All wounds were injected with Exparel.  The supraumbilical fascia as well as suprapubic fascia were reapproximated using 0 Vicryl interrupted sutures.  All skin incisions were closed using a 4-0 Monocryl subcuticular suture.  Dermabond was applied. ? ?All tape and needle counts were correct at the end of the procedure.  The patient was extubated in the operating room and transferred to PACU in stable condition. ? ?Complications: None ? ?EBL: Minimal ? ?Specimen: Appendix ? ? ?  ?

## 2021-07-31 DIAGNOSIS — K353 Acute appendicitis with localized peritonitis, without perforation or gangrene: Secondary | ICD-10-CM | POA: Diagnosis not present

## 2021-07-31 LAB — BASIC METABOLIC PANEL
Anion gap: 8 (ref 5–15)
BUN: 20 mg/dL (ref 8–23)
CO2: 26 mmol/L (ref 22–32)
Calcium: 8.5 mg/dL — ABNORMAL LOW (ref 8.9–10.3)
Chloride: 102 mmol/L (ref 98–111)
Creatinine, Ser: 0.98 mg/dL (ref 0.61–1.24)
GFR, Estimated: >60 mL/min (ref >60)
Glucose, Bld: 135 mg/dL — ABNORMAL HIGH (ref 70–99)
Potassium: 3.4 mmol/L — ABNORMAL LOW (ref 3.5–5.1)
Sodium: 136 mmol/L (ref 135–145)

## 2021-07-31 LAB — CBC
HCT: 37.7 % — ABNORMAL LOW (ref 39.0–52.0)
Hemoglobin: 12.9 g/dL — ABNORMAL LOW (ref 13.0–17.0)
MCH: 29.7 pg (ref 26.0–34.0)
MCHC: 34.2 g/dL (ref 30.0–36.0)
MCV: 86.9 fL (ref 80.0–100.0)
Platelets: 202 10*3/uL (ref 150–400)
RBC: 4.34 MIL/uL (ref 4.22–5.81)
RDW: 13.8 % (ref 11.5–15.5)
WBC: 12.4 10*3/uL — ABNORMAL HIGH (ref 4.0–10.5)
nRBC: 0 % (ref 0.0–0.2)

## 2021-07-31 NOTE — Discharge Summary (Signed)
Physician Discharge Summary  ?Patient ID: ?Richard Becker ?MRN: [id number] ?DOB/AGE: 08/20/1945 76 y.o. ? ?Admit date: 07/30/2021 ?Discharge date: 07/31/2021 ? ?Admission Diagnoses: Acute appendicitis ? ?Discharge Diagnoses:  ?Principal Problem: ?  S/P laparoscopic appendectomy ?Active Problems: ?  Acute appendicitis with localized peritonitis, without perforation, abscess, or gangrene ? ? ?Discharged Condition: good ? ?Hospital Course: Patient is a 76 year old white male who presented to the emergency room after undergoing an outpatient CT scan of the abdomen which revealed acute appendicitis.  He had been having right lower quadrant abdominal pain for approximately 24 hours.  He was taken to the operating room on 07/30/2021 and underwent laparoscopic appendectomy.  He tolerated the procedure well.  His postoperative course has been unremarkable.  His diet was advanced without difficulty.  The patient is being discharged home on 07/31/2021 in good and improving condition. ? ?Treatments: surgery: Laparoscopic appendectomy on 07/30/2021 ? ?Discharge Exam: ?Blood pressure (!) 130/59, pulse 80, temperature 98.5 ?F (36.9 ?C), temperature source Oral, resp. rate 17, height 6' (1.829 m), weight 95.3 kg, SpO2 95 %. ?General appearance: alert, cooperative, and no distress ?Resp: clear to auscultation bilaterally ?Cardio: regular rate and rhythm, S1, S2 normal, no murmur, click, rub or gallop ?GI: Soft, incisions healing well. ? ?Disposition: Discharge disposition: 01-Home or Self Care ? ? ? ? ? ? ?Discharge Instructions   ? ? Diet - low sodium heart healthy   Complete by: As directed ?  ? Increase activity slowly   Complete by: As directed ?  ? ?  ? ?Allergies as of 07/31/2021   ?No Known Allergies ?  ? ?  ?Medication List  ?  ? ?TAKE these medications   ? ?amLODipine 5 MG tablet ?Commonly known as: NORVASC ?TAKE ONE TABLET BY MOUTH ONCE DAILY ?  ?benazepril 20 MG tablet ?Commonly known as: LOTENSIN ?TAKE ONE TABLET BY MOUTH ONCE  DAILY ?  ?doxazosin 2 MG tablet ?Commonly known as: CARDURA ?Take 1 tablet (2 mg total) by mouth daily. ?What changed: when to take this ?  ?Garlic 300 MG Caps ?Take 600 mg by mouth. ?  ?indapamide 2.5 MG tablet ?Commonly known as: LOZOL ?Take 1 tablet (2.5 mg total) by mouth daily. ?  ? ?  ? ? Follow-up Information   ? ? [name], MD Follow up.   ?Specialty: General Surgery ?Why: As needed.  Will call you next week for follow up. ?Contact information: ?[address] ?[place] [address] ?[phone] ? ? ?  ?  ? ?  ?  ? ?  ? ? ?Signed: ?[name] ?07/31/2021, 11:58 AM ? ? ?

## 2021-08-01 LAB — SURGICAL PATHOLOGY

## 2021-08-02 ENCOUNTER — Encounter (HOSPITAL_COMMUNITY): Payer: Self-pay | Admitting: General Surgery

## 2021-08-26 ENCOUNTER — Telehealth: Payer: Self-pay

## 2021-08-26 DIAGNOSIS — E782 Mixed hyperlipidemia: Secondary | ICD-10-CM

## 2021-08-26 DIAGNOSIS — I10 Essential (primary) hypertension: Secondary | ICD-10-CM

## 2021-08-26 DIAGNOSIS — N400 Enlarged prostate without lower urinary tract symptoms: Secondary | ICD-10-CM

## 2021-08-26 NOTE — Telephone Encounter (Signed)
Caller name:Aliou Rapley  ? ?On DPR? :Yes ? ?Call back [phone] ? ?Provider they see: Adriana Simas  ? ?Reason for call:Pt is wanting to have full blood work ordered he says he does this once a year and want to have it done before his med check appt on 04/12 ? ?

## 2021-08-27 NOTE — Telephone Encounter (Signed)
LM on vm that lab work was ordered and any questions to call office. ?

## 2021-08-29 DIAGNOSIS — N400 Enlarged prostate without lower urinary tract symptoms: Secondary | ICD-10-CM | POA: Diagnosis not present

## 2021-08-29 DIAGNOSIS — I10 Essential (primary) hypertension: Secondary | ICD-10-CM | POA: Diagnosis not present

## 2021-08-29 DIAGNOSIS — E782 Mixed hyperlipidemia: Secondary | ICD-10-CM | POA: Diagnosis not present

## 2021-08-30 LAB — CBC WITH DIFFERENTIAL/PLATELET
Basophils Absolute: 0 10*3/uL (ref 0.0–0.2)
Basos: 1 %
EOS (ABSOLUTE): 0.2 10*3/uL (ref 0.0–0.4)
Eos: 3 %
Hematocrit: 43 % (ref 37.5–51.0)
Hemoglobin: 14.7 g/dL (ref 13.0–17.7)
Immature Grans (Abs): 0 10*3/uL (ref 0.0–0.1)
Immature Granulocytes: 0 %
Lymphocytes Absolute: 1.2 10*3/uL (ref 0.7–3.1)
Lymphs: 20 %
MCH: 28.9 pg (ref 26.6–33.0)
MCHC: 34.2 g/dL (ref 31.5–35.7)
MCV: 85 fL (ref 79–97)
Monocytes Absolute: 0.4 10*3/uL (ref 0.1–0.9)
Monocytes: 7 %
Neutrophils Absolute: 4.2 10*3/uL (ref 1.4–7.0)
Neutrophils: 69 %
Platelets: 261 10*3/uL (ref 150–450)
RBC: 5.08 x10E6/uL (ref 4.14–5.80)
RDW: 13.8 % (ref 11.6–15.4)
WBC: 6.1 10*3/uL (ref 3.4–10.8)

## 2021-08-30 LAB — LIPID PANEL
Chol/HDL Ratio: 3.9 ratio (ref 0.0–5.0)
Cholesterol, Total: 193 mg/dL (ref 100–199)
HDL: 50 mg/dL (ref >39)
LDL Chol Calc (NIH): 130 mg/dL — ABNORMAL HIGH (ref 0–99)
Triglycerides: 73 mg/dL (ref 0–149)
VLDL Cholesterol Cal: 13 mg/dL (ref 5–40)

## 2021-08-30 LAB — CMP14+EGFR
ALT: 13 IU/L (ref 0–44)
AST: 16 IU/L (ref 0–40)
Albumin/Globulin Ratio: 2 (ref 1.2–2.2)
Albumin: 4.8 g/dL — ABNORMAL HIGH (ref 3.7–4.7)
Alkaline Phosphatase: 74 IU/L (ref 44–121)
BUN/Creatinine Ratio: 20 (ref 10–24)
BUN: 22 mg/dL (ref 8–27)
Bilirubin Total: 1.1 mg/dL (ref 0.0–1.2)
CO2: 22 mmol/L (ref 20–29)
Calcium: 9.7 mg/dL (ref 8.6–10.2)
Chloride: 98 mmol/L (ref 96–106)
Creatinine, Ser: 1.12 mg/dL (ref 0.76–1.27)
Globulin, Total: 2.4 g/dL (ref 1.5–4.5)
Glucose: 118 mg/dL — ABNORMAL HIGH (ref 70–99)
Potassium: 3.5 mmol/L (ref 3.5–5.2)
Sodium: 138 mmol/L (ref 134–144)
Total Protein: 7.2 g/dL (ref 6.0–8.5)
eGFR: 69 mL/min/{1.73_m2} (ref >59)

## 2021-08-30 LAB — PSA: Prostate Specific Ag, Serum: 5.3 ng/mL — ABNORMAL HIGH (ref 0.0–4.0)

## 2021-09-04 ENCOUNTER — Ambulatory Visit (INDEPENDENT_AMBULATORY_CARE_PROVIDER_SITE_OTHER): Payer: Medicare Other | Admitting: Family Medicine

## 2021-09-04 VITALS — BP 134/80 | HR 78 | Temp 97.9°F | Ht 72.0 in | Wt 205.4 lb

## 2021-09-04 DIAGNOSIS — I10 Essential (primary) hypertension: Secondary | ICD-10-CM | POA: Diagnosis not present

## 2021-09-04 DIAGNOSIS — N400 Enlarged prostate without lower urinary tract symptoms: Secondary | ICD-10-CM | POA: Diagnosis not present

## 2021-09-04 DIAGNOSIS — E782 Mixed hyperlipidemia: Secondary | ICD-10-CM | POA: Diagnosis not present

## 2021-09-04 MED ORDER — DOXAZOSIN MESYLATE 2 MG PO TABS: 2.0000 mg | ORAL_TABLET | Freq: Every evening | ORAL | 1 refills | Status: DC

## 2021-09-04 MED ORDER — INDAPAMIDE 2.5 MG PO TABS: 2.5000 mg | ORAL_TABLET | Freq: Every day | ORAL | 1 refills | Status: DC

## 2021-09-04 MED ORDER — AMLODIPINE BESYLATE 5 MG PO TABS: ORAL_TABLET | ORAL | 1 refills | Status: DC

## 2021-09-04 MED ORDER — BENAZEPRIL HCL 20 MG PO TABS
ORAL_TABLET | ORAL | 1 refills | Status: DC
Start: 2021-09-04 — End: 2021-12-05

## 2021-09-04 NOTE — Progress Notes (Signed)
? ?Subjective:  ?Patient ID: Richard Becker, male    DOB: 1946-03-24  Age: 76 y.o. MRN: [id number] ? ?CC: ?Chief Complaint  ?Patient presents with  ? Follow-up  ?  Patient has two incisions that he wants to be looked at  ? ? ?HPI: ? ?76 year old male with hypertension, BPH with elevated PSA, hyperlipidemia presents for follow-up. ? ?Patient reports that he is doing well.  Patient recently had appendicitis and had laparoscopic appendectomy.  He would like for me to look at his incisions today. ? ?Hypertension is well controlled.  He is on amlodipine, benazepril, and indapamide. ? ?Patient has hyperlipidemia.  Patient's current 10 year ASCVD risk score is 28.3%.  We will discuss lipid-lowering therapy today. ? ?Patient Active Problem List  ? Diagnosis Date Noted  ? BPH (benign prostatic hyperplasia) 07/30/2021  ? S/P laparoscopic appendectomy 07/30/2021  ? Health care maintenance 03/05/2021  ? Essential hypertension, benign 09/21/2012  ? Hyperlipidemia, mixed 09/21/2012  ? ? ?Social Hx   ?Social History  ? ?Socioeconomic History  ? Marital status: Single  ?  Spouse name: Not on file  ? Number of children: Not on file  ? Years of education: Not on file  ? Highest education level: Not on file  ?Occupational History  ? Not on file  ?Tobacco Use  ? Smoking status: Never  ? Smokeless tobacco: Never  ?Substance and Sexual Activity  ? Alcohol use: No  ? Drug use: No  ? Sexual activity: Not on file  ?Other Topics Concern  ? Not on file  ?Social History Narrative  ? Not on file  ? ?Social Determinants of Health  ? ?Financial Resource Strain: Not on file  ?Food Insecurity: Not on file  ?Transportation Needs: Not on file  ?Physical Activity: Not on file  ?Stress: Not on file  ?Social Connections: Not on file  ? ? ?Review of Systems  ?Constitutional: Negative.   ?Respiratory: Negative.    ?Cardiovascular: Negative.   ? ? ?Objective:  ?BP 134/80   Pulse 78   Temp 97.9 ?F (36.6 ?C) (Oral)   Ht 6' (1.829 m)   Wt 205 lb 6.4 oz  (93.2 kg)   SpO2 99%   BMI 27.86 kg/m?  ? ? ?  09/04/2021  ?  9:04 AM 07/31/2021  ? 12:40 PM 07/31/2021  ?  6:26 AM  ?BP/Weight  ?Systolic BP [id number]  ?Diastolic BP 80 69 59  ?Wt. (Lbs) 205.4    ?BMI 27.86 kg/m2    ? ? ?Physical Exam ?Vitals and nursing note reviewed.  ?Constitutional:   ?   General: He is not in acute distress. ?   Appearance: Normal appearance. He is not ill-appearing.  ?HENT:  ?   Head: Normocephalic and atraumatic.  ?Cardiovascular:  ?   Rate and Rhythm: Normal rate and regular rhythm.  ?Pulmonary:  ?   Effort: Pulmonary effort is normal.  ?   Breath sounds: Normal breath sounds. No wheezing, rhonchi or rales.  ?Abdominal:  ?   General: There is no distension.  ?   Palpations: Abdomen is soft.  ?   Tenderness: There is no abdominal tenderness.  ?   Comments: Incisions well-healed.  ?Neurological:  ?   Mental Status: He is alert.  ?Psychiatric:     ?   Mood and Affect: Mood normal.     ?   Behavior: Behavior normal.  ? ? ?Lab Results  ?Component Value Date  ? WBC 6.1 08/29/2021  ?  HGB 14.7 08/29/2021  ? HCT 43.0 08/29/2021  ? PLT 261 08/29/2021  ? GLUCOSE 118 (H) 08/29/2021  ? CHOL 193 08/29/2021  ? TRIG 73 08/29/2021  ? HDL 50 08/29/2021  ? LDLCALC 130 (H) 08/29/2021  ? ALT 13 08/29/2021  ? AST 16 08/29/2021  ? NA 138 08/29/2021  ? K 3.5 08/29/2021  ? CL 98 08/29/2021  ? CREATININE 1.12 08/29/2021  ? BUN 22 08/29/2021  ? CO2 22 08/29/2021  ? PSA 3.26 04/13/2014  ? ? ? ?Assessment & Plan:  ? ?Problem List Items Addressed This Visit   ? ?  ? Cardiovascular and Mediastinum  ? Essential hypertension, benign  ?  Stable.  Continue current medications.  Medications refilled today. ?  ?  ? Relevant Medications  ? doxazosin (CARDURA) 2 MG tablet  ? indapamide (LOZOL) 2.5 MG tablet  ? benazepril (LOTENSIN) 20 MG tablet  ? amLODipine (NORVASC) 5 MG tablet  ?  ? Genitourinary  ? BPH (benign prostatic hyperplasia)  ?  PSA rising.  I notified his urologist regarding his PSA. ?  ?  ?  ? Other  ?  Hyperlipidemia, mixed  ?  Once again discussed risk and recommendations for statin therapy.  Patient does not want lipid-lowering therapy at this time.  He will continue dietary changes. ?  ?  ? Relevant Medications  ? doxazosin (CARDURA) 2 MG tablet  ? indapamide (LOZOL) 2.5 MG tablet  ? benazepril (LOTENSIN) 20 MG tablet  ? amLODipine (NORVASC) 5 MG tablet  ? ? ?Meds ordered this encounter  ?Medications  ? doxazosin (CARDURA) 2 MG tablet  ?  Sig: Take 1 tablet (2 mg total) by mouth every evening.  ?  Dispense:  90 tablet  ?  Refill:  1  ? indapamide (LOZOL) 2.5 MG tablet  ?  Sig: Take 1 tablet (2.5 mg total) by mouth daily.  ?  Dispense:  90 tablet  ?  Refill:  1  ? benazepril (LOTENSIN) 20 MG tablet  ?  Sig: TAKE ONE TABLET BY MOUTH ONCE DAILY  ?  Dispense:  90 tablet  ?  Refill:  1  ? amLODipine (NORVASC) 5 MG tablet  ?  Sig: TAKE ONE TABLET BY MOUTH ONCE DAILY  ?  Dispense:  90 tablet  ?  Refill:  1  ? ? ?Follow-up:  Return in about 6 months (around 03/06/2022). ? ?Thersa Salt DO ?[place] ? ?

## 2021-09-04 NOTE — Patient Instructions (Signed)
Continue your medications. ? ?Consider cholesterol medication. ? ?Follow up in 6 months. ? ?Take care ? ?Dr. Adriana Simas  ?

## 2021-09-04 NOTE — Assessment & Plan Note (Signed)
Once again discussed risk and recommendations for statin therapy.  Patient does not want lipid-lowering therapy at this time.  He will continue dietary changes. ?

## 2021-09-04 NOTE — Assessment & Plan Note (Signed)
Stable.  Continue current medications.  Medications refilled today. 

## 2021-09-04 NOTE — Assessment & Plan Note (Signed)
PSA rising.  I notified his urologist regarding his PSA. ?

## 2021-10-08 ENCOUNTER — Telehealth: Payer: Self-pay

## 2021-10-08 NOTE — Telephone Encounter (Signed)
Patient has a upcoming apt with you on 10/29/21.  He is asking if he will need a PSA before that visit? Please advise.  ?

## 2021-10-10 ENCOUNTER — Other Ambulatory Visit: Payer: Self-pay | Admitting: Urology

## 2021-10-10 DIAGNOSIS — R972 Elevated prostate specific antigen [PSA]: Secondary | ICD-10-CM

## 2021-10-10 NOTE — Telephone Encounter (Signed)
Called to inform pt, voiced understanding.

## 2021-10-14 ENCOUNTER — Other Ambulatory Visit: Payer: Medicare Other

## 2021-10-14 DIAGNOSIS — R972 Elevated prostate specific antigen [PSA]: Secondary | ICD-10-CM | POA: Diagnosis not present

## 2021-10-15 LAB — PSA: Prostate Specific Ag, Serum: 4.2 ng/mL — ABNORMAL HIGH (ref 0.0–4.0)

## 2021-10-28 NOTE — Progress Notes (Signed)
History of Present Illness:   5.19.2020: sent by Dr. Gerda Diss for evaluation and management of elevated PSA. PSA levels have been as follows:    11.19.2015--3.26  1.5.20217--3.3  1.30.2018--3.9  1.31.2019--3.9  1.30.2020--4.2.  04/19/2019--2.3.  3.2.2021--4.0. 6.7.2022-- 4.2  6.6.2023: Recent PSA 4.2.  Earlier in April it was 5.3. He had an appendectomy in March of this year.  CT scan was performed.  My calculated prostate volume based on CT prostate measurements 107 mL.  He has stable, nonbothersome urinary symptoms.  He is on doxazosin.  IPSS 4, quality-of-life score 1.  Past Medical History:  Diagnosis Date   Arthritis    Colon polyp    Hypertension    IFG (impaired fasting glucose)     Past Surgical History:  Procedure Laterality Date   Bilateral hand surgery     COLONOSCOPY     COLONOSCOPY N/A 07/20/2014   Procedure: COLONOSCOPY;  Surgeon: Malissa Hippo, MD;  Location: [facility];  Service: Endoscopy;  Laterality: N/A;  830   LAPAROSCOPIC APPENDECTOMY N/A 07/30/2021   Procedure: APPENDECTOMY LAPAROSCOPIC;  Surgeon: [name], MD;  Location: [facility];  Service: General;  Laterality: N/A;    Home Medications:  Allergies as of 10/29/2021   No Known Allergies      Medication List        Accurate as of October 28, 2021  8:05 PM. If you have any questions, ask your nurse or doctor.          amLODipine 5 MG tablet Commonly known as: NORVASC TAKE ONE TABLET BY MOUTH ONCE DAILY   benazepril 20 MG tablet Commonly known as: LOTENSIN TAKE ONE TABLET BY MOUTH ONCE DAILY   doxazosin 2 MG tablet Commonly known as: CARDURA Take 1 tablet (2 mg total) by mouth every evening.   Garlic 300 MG Caps Take 600 mg by mouth.   indapamide 2.5 MG tablet Commonly known as: LOZOL Take 1 tablet (2.5 mg total) by mouth daily.        Allergies: No Known Allergies  Family History  Problem Relation Age of Onset   Cancer Mother        Breast   Heart attack Father      Social History:  reports that he has never smoked. He has never used smokeless tobacco. He reports that he does not drink alcohol and does not use drugs.  ROS: A complete review of systems was performed.  All systems are negative except for pertinent findings as noted.  Physical Exam:  Vital signs in last 24 hours: There were no vitals taken for this visit. Constitutional:  Alert and oriented, No acute distress Cardiovascular: Regular rate  Respiratory: Normal respiratory effort GI: Abdomen is soft, nontender, nondistended, no abdominal masses. No CVAT.  No inguinal hernias Genitourinary: Normal male phallus, testes are descended bilaterally and non-tender and without masses, scrotum is normal in appearance without lesions or masses, perineum is normal on inspection.  Prostate smooth, soft, 80 g in size.  No nodules. Lymphatic: No lymphadenopathy Neurologic: Grossly intact, no focal deficits Psychiatric: Normal mood and affect  I have reviewed prior pt notes  I have reviewed notes from referring/previous physicians  I have reviewed urinalysis results  I have independently reviewed prior imaging--CT images reviewed with patient  I have reviewed prior PSA results  IPSS results reviewed    Impression/Assessment:  1.  BPH without significant lower urinary tract symptoms  2.  Elevated PSA.  PSA density based on calculated prostate  volume 0.04.  Plan:  1.  I did discuss with him the fact that I do not think he needs routine screening after the age of 49  2.  I will have him come back in a year for recheck.  After that, I would suggest he follow-up with with Dr. Adriana Simas for PSA checks if necessary

## 2021-10-29 ENCOUNTER — Encounter: Payer: Self-pay | Admitting: Urology

## 2021-10-29 ENCOUNTER — Ambulatory Visit (INDEPENDENT_AMBULATORY_CARE_PROVIDER_SITE_OTHER): Payer: Medicare Other | Admitting: Urology

## 2021-10-29 VITALS — BP 129/80 | HR 74

## 2021-10-29 DIAGNOSIS — N400 Enlarged prostate without lower urinary tract symptoms: Secondary | ICD-10-CM | POA: Diagnosis not present

## 2021-10-29 DIAGNOSIS — R972 Elevated prostate specific antigen [PSA]: Secondary | ICD-10-CM

## 2021-10-29 LAB — URINALYSIS, ROUTINE W REFLEX MICROSCOPIC
Bilirubin, UA: NEGATIVE
Glucose, UA: NEGATIVE
Leukocytes,UA: NEGATIVE
Nitrite, UA: NEGATIVE
Protein,UA: NEGATIVE
RBC, UA: NEGATIVE
Specific Gravity, UA: 1.025 (ref 1.005–1.030)
Urobilinogen, Ur: 0.2 mg/dL (ref 0.2–1.0)
pH, UA: 6 (ref 5.0–7.5)

## 2021-12-05 ENCOUNTER — Other Ambulatory Visit: Payer: Self-pay | Admitting: Family Medicine

## 2021-12-05 DIAGNOSIS — I10 Essential (primary) hypertension: Secondary | ICD-10-CM

## 2022-02-12 ENCOUNTER — Telehealth: Payer: Self-pay | Admitting: *Deleted

## 2022-02-12 NOTE — Patient Outreach (Signed)
  Care Coordination   02/12/2022 Name: Richard Becker MRN: [id number] DOB: Sep 16, 1945   Care Coordination Outreach Attempts:  An unsuccessful telephone outreach was attempted today to offer the patient information about available care coordination services as a benefit of their health plan.   Follow Up Plan:  Additional outreach attempts will be made to offer the patient care coordination information and services.   Encounter Outcome:  No Answer  Care Coordination Interventions Activated:  No   Care Coordination Interventions:  No, not indicated    Jacqlyn Larsen [facility], [place] RN Care Coordinator [phone]

## 2022-02-13 ENCOUNTER — Telehealth: Payer: Self-pay | Admitting: *Deleted

## 2022-02-13 NOTE — Patient Outreach (Signed)
  Care Coordination   02/13/2022 Name: Richard Becker MRN: [id number] DOB: 04-Jun-1945   Care Coordination Outreach Attempts:  A second unsuccessful outreach was attempted today to offer the patient with information about available care coordination services as a benefit of their health plan.     Follow Up Plan:  Additional outreach attempts will be made to offer the patient care coordination information and services.   Encounter Outcome:  No Answer  Care Coordination Interventions Activated:  No   Care Coordination Interventions:  No, not indicated     Jacqlyn Larsen [facility], [place] RN Care Coordinator [phone]

## 2022-02-14 ENCOUNTER — Encounter: Payer: Self-pay | Admitting: *Deleted

## 2022-02-14 ENCOUNTER — Telehealth: Payer: Self-pay | Admitting: *Deleted

## 2022-02-14 NOTE — Patient Outreach (Signed)
  Care Coordination   Initial Visit Note   02/14/2022 Name: Richard Becker MRN: [id number] DOB: 05-13-1946  Richard Becker is a 76 y.o. year old male who sees Coral Spikes, DO for primary care. I spoke with  Richard Becker by phone today.  What matters to the patients health and wellness today?  "to stay healthy"    Goals Addressed               This Visit's Progress     COMPLETED: "to stay healthy" (pt-stated)        Care Coordination Interventions: Patient interviewed about adult health maintenance status including  importance of yearly Annual Wellness Visit Provided education about importance of taking all medications as prescribed, reviewed medications with patient Evaluation of current treatment plan related to hypertension self management and patient's adherence to plan as established by provider Advised patient, providing education and rationale, to monitor blood pressure daily and record, calling PCP for findings outside established parameters Discussed complications of poorly controlled blood pressure such as heart disease, stroke, circulatory complications, vision complications, kidney impairment, sexual dysfunction Assessed social determinant of health barriers Explained care coordination program, pt agreeable to today's call but declines any further outreach stating "managing well"          SDOH assessments and interventions completed:  Yes  SDOH Interventions Today    Flowsheet Row Most Recent Value  SDOH Interventions   Food Insecurity Interventions Intervention Not Indicated  Transportation Interventions Intervention Not Indicated        Care Coordination Interventions Activated:  Yes  Care Coordination Interventions:  Yes, provided   Follow up plan: No further intervention required.   Encounter Outcome:  Pt. Visit Completed   Jacqlyn Larsen [facility], BSN [facility] RN Care Coordinator [phone]

## 2022-03-11 ENCOUNTER — Encounter: Payer: Self-pay | Admitting: Family Medicine

## 2022-03-11 ENCOUNTER — Ambulatory Visit (INDEPENDENT_AMBULATORY_CARE_PROVIDER_SITE_OTHER): Payer: Medicare Other | Admitting: Family Medicine

## 2022-03-11 VITALS — BP 128/72 | HR 74 | Temp 97.7°F | Wt 211.0 lb

## 2022-03-11 DIAGNOSIS — N400 Enlarged prostate without lower urinary tract symptoms: Secondary | ICD-10-CM | POA: Diagnosis not present

## 2022-03-11 DIAGNOSIS — M720 Palmar fascial fibromatosis [Dupuytren]: Secondary | ICD-10-CM | POA: Insufficient documentation

## 2022-03-11 DIAGNOSIS — L853 Xerosis cutis: Secondary | ICD-10-CM | POA: Insufficient documentation

## 2022-03-11 DIAGNOSIS — Z23 Encounter for immunization: Secondary | ICD-10-CM | POA: Diagnosis not present

## 2022-03-11 DIAGNOSIS — I10 Essential (primary) hypertension: Secondary | ICD-10-CM | POA: Diagnosis not present

## 2022-03-11 MED ORDER — AMLODIPINE BESYLATE 5 MG PO TABS: 5.0000 mg | ORAL_TABLET | Freq: Every day | ORAL | 3 refills | Status: DC

## 2022-03-11 MED ORDER — DOXAZOSIN MESYLATE 2 MG PO TABS: 2.0000 mg | ORAL_TABLET | Freq: Every day | ORAL | 3 refills | Status: DC

## 2022-03-11 MED ORDER — CLOBETASOL PROPIONATE 0.05 % EX CREA
1.0000 | TOPICAL_CREAM | Freq: Two times a day (BID) | CUTANEOUS | 0 refills | Status: DC
Start: 2022-03-11 — End: 2022-09-10

## 2022-03-11 MED ORDER — INDAPAMIDE 2.5 MG PO TABS: 2.5000 mg | ORAL_TABLET | Freq: Every day | ORAL | 3 refills | Status: DC

## 2022-03-11 MED ORDER — BENAZEPRIL HCL 20 MG PO TABS: 20.0000 mg | ORAL_TABLET | Freq: Every day | ORAL | 3 refills | Status: DC

## 2022-03-11 NOTE — Assessment & Plan Note (Signed)
Stable.  Continue Cardura.  Refilled today.

## 2022-03-11 NOTE — Assessment & Plan Note (Signed)
Stable.  Continue current medications.  Medications refilled today. 

## 2022-03-11 NOTE — Assessment & Plan Note (Signed)
On the palms of the hands.  Trial of clobetasol.

## 2022-03-11 NOTE — Patient Instructions (Signed)
Medications as prescribed.  Follow up in 6 months.  Take care  Dr. Jakie Debow  

## 2022-03-11 NOTE — Progress Notes (Signed)
Subjective:  Patient ID: Richard Becker, male    DOB: May 29, 1945  Age: 76 y.o. MRN: [id number]  CC: Chief Complaint  Patient presents with   Hypertension    Pt arrives to follow up HTN. No issues or concerns.     HPI:  76 year old male with BPH, hypertension, Dupuytren's contracture, hyperlipidemia presents for follow-up.  Hypertension is stable on amlodipine, benazepril, and indapamide.  Denies chest pain, shortness of breath.  BPH stable at this time on doxazosin.  Patient would like his flu vaccine today.  He is inquiring about the need for RSV vaccine as well as the COVID-vaccine.  Patient reports ongoing issues with dry skin on the palms of his hands.  He states that it is not improved with over-the-counter moisturizer.  He states that he does not wash his hands excessively.  No new contacts or exposures.  Patient Active Problem List   Diagnosis Date Noted   Dupuytren's contracture 03/11/2022   Dry skin dermatitis 03/11/2022   BPH (benign prostatic hyperplasia) 07/30/2021   S/P laparoscopic appendectomy 07/30/2021   Health care maintenance 03/05/2021   Essential hypertension, benign 09/21/2012   Hyperlipidemia, mixed 09/21/2012    Social Hx   Social History   Socioeconomic History   Marital status: Single    Spouse name: Not on file   Number of children: Not on file   Years of education: Not on file   Highest education level: Not on file  Occupational History   Not on file  Tobacco Use   Smoking status: Never   Smokeless tobacco: Never  Substance and Sexual Activity   Alcohol use: No   Drug use: No   Sexual activity: Not on file  Other Topics Concern   Not on file  Social History Narrative   Not on file   Social Determinants of Health   Financial Resource Strain: Not on file  Food Insecurity: No Food Insecurity (02/14/2022)   Hunger Vital Sign    Worried About Running Out of Food in the Last Year: Never true    Ran Out of Food in the Last Year:  Never true  Transportation Needs: No Transportation Needs (02/14/2022)   PRAPARE - Hydrologist (Medical): No    Lack of Transportation (Non-Medical): No  Physical Activity: Not on file  Stress: Not on file  Social Connections: Not on file    Review of Systems Per HPI  Objective:  BP 128/72   Pulse 74   Temp 97.7 F (36.5 C)   Wt 211 lb (95.7 kg)   SpO2 97%   BMI 28.62 kg/m      03/11/2022    8:17 AM 10/29/2021    9:17 AM 09/04/2021    9:04 AM  BP/Weight  Systolic BP 427 062 376  Diastolic BP 72 80 80  Wt. (Lbs) 211  205.4  BMI 28.62 kg/m2  27.86 kg/m2    Physical Exam Vitals and nursing note reviewed.  Constitutional:      General: He is not in acute distress.    Appearance: Normal appearance.  HENT:     Head: Normocephalic and atraumatic.  Eyes:     General:        Right eye: No discharge.        Left eye: No discharge.     Conjunctiva/sclera: Conjunctivae normal.  Cardiovascular:     Rate and Rhythm: Normal rate and regular rhythm.  Pulmonary:     Effort: Pulmonary  effort is normal.     Breath sounds: Normal breath sounds. No wheezing, rhonchi or rales.  Abdominal:     General: There is no distension.     Palpations: Abdomen is soft.     Tenderness: There is no abdominal tenderness.  Skin:    Comments: Palms of the hands with dry skin.  No appreciable cracking or fissures currently.  Neurological:     Mental Status: He is alert.  Psychiatric:        Mood and Affect: Mood normal.        Behavior: Behavior normal.     Lab Results  Component Value Date   WBC 6.1 08/29/2021   HGB 14.7 08/29/2021   HCT 43.0 08/29/2021   PLT 261 08/29/2021   GLUCOSE 118 (H) 08/29/2021   CHOL 193 08/29/2021   TRIG 73 08/29/2021   HDL 50 08/29/2021   LDLCALC 130 (H) 08/29/2021   ALT 13 08/29/2021   AST 16 08/29/2021   NA 138 08/29/2021   K 3.5 08/29/2021   CL 98 08/29/2021   CREATININE 1.12 08/29/2021   BUN 22 08/29/2021   CO2 22  08/29/2021   PSA 3.26 04/13/2014     Assessment & Plan:   Problem List Items Addressed This Visit       Cardiovascular and Mediastinum   Essential hypertension, benign - Primary    Stable.  Continue current medications.  Medications refilled today.      Relevant Medications   amLODipine (NORVASC) 5 MG tablet   benazepril (LOTENSIN) 20 MG tablet   doxazosin (CARDURA) 2 MG tablet   indapamide (LOZOL) 2.5 MG tablet     Musculoskeletal and Integument   Dry skin dermatitis    On the palms of the hands.  Trial of clobetasol.        Genitourinary   BPH (benign prostatic hyperplasia)    Stable.  Continue Cardura.  Refilled today.      Other Visit Diagnoses     Need for vaccination       Relevant Orders   Flu Vaccine QUAD High Dose(Fluad) (Completed)       Meds ordered this encounter  Medications   amLODipine (NORVASC) 5 MG tablet    Sig: Take 1 tablet (5 mg total) by mouth daily.    Dispense:  90 tablet    Refill:  3   benazepril (LOTENSIN) 20 MG tablet    Sig: Take 1 tablet (20 mg total) by mouth daily.    Dispense:  90 tablet    Refill:  3   doxazosin (CARDURA) 2 MG tablet    Sig: Take 1 tablet (2 mg total) by mouth daily.    Dispense:  90 tablet    Refill:  3   indapamide (LOZOL) 2.5 MG tablet    Sig: Take 1 tablet (2.5 mg total) by mouth daily.    Dispense:  90 tablet    Refill:  3   clobetasol cream (TEMOVATE) 0.05 %    Sig: Apply 1 Application topically 2 (two) times daily.    Dispense:  30 g    Refill:  0    Follow-up:  Return in about 6 months (around 09/10/2022).  Everlene Other DO [facility]

## 2022-09-04 ENCOUNTER — Other Ambulatory Visit: Payer: Self-pay | Admitting: *Deleted

## 2022-09-04 DIAGNOSIS — E782 Mixed hyperlipidemia: Secondary | ICD-10-CM

## 2022-09-04 DIAGNOSIS — I10 Essential (primary) hypertension: Secondary | ICD-10-CM | POA: Diagnosis not present

## 2022-09-04 DIAGNOSIS — N400 Enlarged prostate without lower urinary tract symptoms: Secondary | ICD-10-CM

## 2022-09-05 LAB — CBC WITH DIFFERENTIAL/PLATELET
Basophils Absolute: 0.1 10*3/uL (ref 0.0–0.2)
Basos: 1 %
EOS (ABSOLUTE): 0.1 10*3/uL (ref 0.0–0.4)
Eos: 1 %
Hematocrit: 40.2 % (ref 37.5–51.0)
Hemoglobin: 13.4 g/dL (ref 13.0–17.7)
Immature Grans (Abs): 0 10*3/uL (ref 0.0–0.1)
Immature Granulocytes: 0 %
Lymphocytes Absolute: 1.4 10*3/uL (ref 0.7–3.1)
Lymphs: 21 %
MCH: 28.9 pg (ref 26.6–33.0)
MCHC: 33.3 g/dL (ref 31.5–35.7)
MCV: 87 fL (ref 79–97)
Monocytes Absolute: 0.5 10*3/uL (ref 0.1–0.9)
Monocytes: 8 %
Neutrophils Absolute: 4.6 10*3/uL (ref 1.4–7.0)
Neutrophils: 69 %
Platelets: 308 10*3/uL (ref 150–450)
RBC: 4.64 x10E6/uL (ref 4.14–5.80)
RDW: 13.5 % (ref 11.6–15.4)
WBC: 6.6 10*3/uL (ref 3.4–10.8)

## 2022-09-05 LAB — CMP14+EGFR
ALT: 15 IU/L (ref 0–44)
AST: 19 IU/L (ref 0–40)
Albumin/Globulin Ratio: 1.8 (ref 1.2–2.2)
Albumin: 4.7 g/dL (ref 3.8–4.8)
Alkaline Phosphatase: 72 IU/L (ref 44–121)
BUN/Creatinine Ratio: 21 (ref 10–24)
BUN: 23 mg/dL (ref 8–27)
Bilirubin Total: 0.8 mg/dL (ref 0.0–1.2)
CO2: 22 mmol/L (ref 20–29)
Calcium: 9.7 mg/dL (ref 8.6–10.2)
Chloride: 99 mmol/L (ref 96–106)
Creatinine, Ser: 1.11 mg/dL (ref 0.76–1.27)
Globulin, Total: 2.6 g/dL (ref 1.5–4.5)
Glucose: 104 mg/dL — ABNORMAL HIGH (ref 70–99)
Potassium: 3.7 mmol/L (ref 3.5–5.2)
Sodium: 138 mmol/L (ref 134–144)
Total Protein: 7.3 g/dL (ref 6.0–8.5)
eGFR: 69 mL/min/{1.73_m2} (ref >59)

## 2022-09-05 LAB — LIPID PANEL
Chol/HDL Ratio: 4.4 ratio (ref 0.0–5.0)
Cholesterol, Total: 206 mg/dL — ABNORMAL HIGH (ref 100–199)
HDL: 47 mg/dL (ref >39)
LDL Chol Calc (NIH): 144 mg/dL — ABNORMAL HIGH (ref 0–99)
Triglycerides: 85 mg/dL (ref 0–149)
VLDL Cholesterol Cal: 15 mg/dL (ref 5–40)

## 2022-09-10 ENCOUNTER — Encounter: Payer: Self-pay | Admitting: Family Medicine

## 2022-09-10 ENCOUNTER — Ambulatory Visit (INDEPENDENT_AMBULATORY_CARE_PROVIDER_SITE_OTHER): Payer: Medicare Other | Admitting: Family Medicine

## 2022-09-10 VITALS — BP 122/75 | HR 60 | Temp 97.3°F | Ht 72.0 in | Wt 213.0 lb

## 2022-09-10 DIAGNOSIS — N400 Enlarged prostate without lower urinary tract symptoms: Secondary | ICD-10-CM

## 2022-09-10 DIAGNOSIS — M720 Palmar fascial fibromatosis [Dupuytren]: Secondary | ICD-10-CM

## 2022-09-10 DIAGNOSIS — E782 Mixed hyperlipidemia: Secondary | ICD-10-CM

## 2022-09-10 DIAGNOSIS — I10 Essential (primary) hypertension: Secondary | ICD-10-CM

## 2022-09-10 MED ORDER — DOXAZOSIN MESYLATE 2 MG PO TABS: 2.0000 mg | ORAL_TABLET | Freq: Every day | ORAL | 3 refills | Status: DC

## 2022-09-10 MED ORDER — BENAZEPRIL HCL 20 MG PO TABS: 20.0000 mg | ORAL_TABLET | Freq: Every day | ORAL | 3 refills | Status: DC

## 2022-09-10 MED ORDER — AMLODIPINE BESYLATE 5 MG PO TABS: 5.0000 mg | ORAL_TABLET | Freq: Every day | ORAL | 3 refills | Status: DC

## 2022-09-10 MED ORDER — INDAPAMIDE 2.5 MG PO TABS: 2.5000 mg | ORAL_TABLET | Freq: Every day | ORAL | 3 refills | Status: DC

## 2022-09-10 NOTE — Assessment & Plan Note (Signed)
Stable.  Continue indapamide, benazepril, and amlodipine.

## 2022-09-10 NOTE — Patient Instructions (Signed)
Continue your medications.  Follow up in 6 months to 1 year.  Take care  Dr. Eathan Groman  

## 2022-09-10 NOTE — Assessment & Plan Note (Addendum)
Stable on Cardura. Continue. 

## 2022-09-10 NOTE — Progress Notes (Signed)
Subjective:  Patient ID: Richard Becker, male    DOB: 08-14-1945  Age: 77 y.o. MRN: [id number]  CC: Chief Complaint  Patient presents with   Hypertension   extry dry skin on hands    Request for a new cream     HPI:  77 year old male presents for follow-up.  Hypertension is well-controlled on amlodipine, benazepril, and indapamide.  BPH stable on Cardura.  Patient's lipids are elevated.  LDL 144.  Patient declines statin therapy.  He states that he is overall doing well.  He is considering getting injection for his Dupuytren's contracture.  He has had prior surgery and does not want to undergo this again.  He also continues to have issues with dry skin dermatitis of the hands.  He has tried clobetasol and numerous over-the-counter creams without resolution.   Patient Active Problem List   Diagnosis Date Noted   Dupuytren's contracture 03/11/2022   Dry skin dermatitis 03/11/2022   BPH (benign prostatic hyperplasia) 07/30/2021   S/P laparoscopic appendectomy 07/30/2021   Health care maintenance 03/05/2021   Essential hypertension, benign 09/21/2012   Hyperlipidemia, mixed 09/21/2012    Social Hx   Social History   Socioeconomic History   Marital status: Single    Spouse name: Not on file   Number of children: Not on file   Years of education: Not on file   Highest education level: Not on file  Occupational History   Not on file  Tobacco Use   Smoking status: Never   Smokeless tobacco: Never  Substance and Sexual Activity   Alcohol use: No   Drug use: No   Sexual activity: Not on file  Other Topics Concern   Not on file  Social History Narrative   Not on file   Social Determinants of Health   Financial Resource Strain: Not on file  Food Insecurity: No Food Insecurity (02/14/2022)   Hunger Vital Sign    Worried About Running Out of Food in the Last Year: Never true    Ran Out of Food in the Last Year: Never true  Transportation Needs: No Transportation  Needs (02/14/2022)   PRAPARE - Administrator, Civil Service (Medical): No    Lack of Transportation (Non-Medical): No  Physical Activity: Not on file  Stress: Not on file  Social Connections: Not on file    Review of Systems Per HPI  Objective:  BP 122/75   Pulse 60   Temp (!) 97.3 F (36.3 C)   Ht 6' (1.829 m)   Wt 213 lb (96.6 kg)   SpO2 98%   BMI 28.89 kg/m      09/10/2022    9:36 AM 03/11/2022    8:17 AM 10/29/2021    9:17 AM  BP/Weight  Systolic BP 122 128 129  Diastolic BP 75 72 80  Wt. (Lbs) 213 211   BMI 28.89 kg/m2 28.62 kg/m2     Physical Exam Vitals and nursing note reviewed.  Constitutional:      General: He is not in acute distress.    Appearance: Normal appearance. He is not ill-appearing.  HENT:     Head: Normocephalic and atraumatic.  Eyes:     General:        Right eye: No discharge.        Left eye: No discharge.     Conjunctiva/sclera: Conjunctivae normal.  Cardiovascular:     Rate and Rhythm: Normal rate and regular rhythm.  Pulmonary:  Effort: Pulmonary effort is normal.     Breath sounds: Normal breath sounds.  Musculoskeletal:     Comments: Dupuytren's contracture noted of the left hand.  Skin:    Comments: The skin of the patient's hands is quite dry on the palmar aspect.  Neurological:     Mental Status: He is alert.  Psychiatric:        Mood and Affect: Mood normal.        Behavior: Behavior normal.     Lab Results  Component Value Date   WBC 6.6 09/04/2022   HGB 13.4 09/04/2022   HCT 40.2 09/04/2022   PLT 308 09/04/2022   GLUCOSE 104 (H) 09/04/2022   CHOL 206 (H) 09/04/2022   TRIG 85 09/04/2022   HDL 47 09/04/2022   LDLCALC 144 (H) 09/04/2022   ALT 15 09/04/2022   AST 19 09/04/2022   NA 138 09/04/2022   K 3.7 09/04/2022   CL 99 09/04/2022   CREATININE 1.11 09/04/2022   BUN 23 09/04/2022   CO2 22 09/04/2022   PSA 3.26 04/13/2014     Assessment & Plan:   Problem List Items Addressed This Visit        Cardiovascular and Mediastinum   Essential hypertension, benign - Primary    Stable.  Continue indapamide, benazepril, and amlodipine.      Relevant Medications   amLODipine (NORVASC) 5 MG tablet   benazepril (LOTENSIN) 20 MG tablet   doxazosin (CARDURA) 2 MG tablet   indapamide (LOZOL) 2.5 MG tablet     Musculoskeletal and Integument   Dupuytren's contracture    Patient considering injection.  Patient wants to put off surgery as long as possible.        Genitourinary   BPH (benign prostatic hyperplasia)    Stable on Cardura.  Continue.        Other   Hyperlipidemia, mixed    Not at goal.  Patient declines treatment at this time.      Relevant Medications   amLODipine (NORVASC) 5 MG tablet   benazepril (LOTENSIN) 20 MG tablet   doxazosin (CARDURA) 2 MG tablet   indapamide (LOZOL) 2.5 MG tablet    Meds ordered this encounter  Medications   amLODipine (NORVASC) 5 MG tablet    Sig: Take 1 tablet (5 mg total) by mouth daily.    Dispense:  90 tablet    Refill:  3   benazepril (LOTENSIN) 20 MG tablet    Sig: Take 1 tablet (20 mg total) by mouth daily.    Dispense:  90 tablet    Refill:  3   doxazosin (CARDURA) 2 MG tablet    Sig: Take 1 tablet (2 mg total) by mouth daily.    Dispense:  90 tablet    Refill:  3   indapamide (LOZOL) 2.5 MG tablet    Sig: Take 1 tablet (2.5 mg total) by mouth daily.    Dispense:  90 tablet    Refill:  3    Follow-up: 6 months to 1 year  Karie Skowron DO [facility]

## 2022-09-10 NOTE — Assessment & Plan Note (Signed)
Patient considering injection.  Patient wants to put off surgery as long as possible.

## 2022-09-10 NOTE — Assessment & Plan Note (Signed)
Not at goal.  Patient declines treatment at this time.

## 2022-10-16 ENCOUNTER — Other Ambulatory Visit: Payer: Self-pay

## 2022-10-16 ENCOUNTER — Other Ambulatory Visit: Payer: Medicare Other

## 2022-10-16 DIAGNOSIS — R972 Elevated prostate specific antigen [PSA]: Secondary | ICD-10-CM

## 2022-10-16 NOTE — Progress Notes (Signed)
Opened in error

## 2022-10-17 LAB — PSA: Prostate Specific Ag, Serum: 3.7 ng/mL (ref 0.0–4.0)

## 2022-10-27 NOTE — Progress Notes (Signed)
History of Present Illness:   5.19.2020: Initial visit for E/M of elevated PSA. PSA levels have been as follows:    11.19.2015--3.26  1.5.20217--3.3  1.30.2018--3.9  1.31.2019--3.9  1.30.2020--4.2.  04/19/2019--2.3.  3.2.2021--4.0. 6.7.2022-- 4.2   6.6.2023: Recent PSA 4.2.  2 mos ago it was 5.3. He had an appendectomy in March of this year.  CT scan was performed.  My calculated prostate volume based on CT prostate measurements 107 mL  6.4.2024; psa 3.7   Past Medical History:  Diagnosis Date   Arthritis    Colon polyp    Hypertension    IFG (impaired fasting glucose)     Past Surgical History:  Procedure Laterality Date   Bilateral hand surgery     COLONOSCOPY     COLONOSCOPY N/A 07/20/2014   Procedure: COLONOSCOPY;  Surgeon: Malissa Hippo, MD;  Location: [facility];  Service: Endoscopy;  Laterality: N/A;  830   LAPAROSCOPIC APPENDECTOMY N/A 07/30/2021   Procedure: APPENDECTOMY LAPAROSCOPIC;  Surgeon: [name], MD;  Location: [facility] ORS;  Service: General;  Laterality: N/A;    Home Medications:  Allergies as of 10/28/2022   No Known Allergies      Medication List        Accurate as of October 27, 2022  8:02 PM. If you have any questions, ask your nurse or doctor.          amLODipine 5 MG tablet Commonly known as: NORVASC Take 1 tablet (5 mg total) by mouth daily.   benazepril 20 MG tablet Commonly known as: LOTENSIN Take 1 tablet (20 mg total) by mouth daily.   doxazosin 2 MG tablet Commonly known as: CARDURA Take 1 tablet (2 mg total) by mouth daily.   Garlic 300 MG Caps Take 600 mg by mouth.   indapamide 2.5 MG tablet Commonly known as: LOZOL Take 1 tablet (2.5 mg total) by mouth daily.        Allergies: No Known Allergies  Family History  Problem Relation Age of Onset   Cancer Mother        Breast   Heart attack Father     Social History:  reports that he has never smoked. He has never used smokeless tobacco. He reports that he  does not drink alcohol and does not use drugs.  ROS: A complete review of systems was performed.  All systems are negative except for pertinent findings as noted.  Physical Exam:  Vital signs in last 24 hours: There were no vitals taken for this visit. Constitutional:  Alert and oriented, No acute distress Cardiovascular: Regular rate  Respiratory: Normal respiratory effort GI: Abdomen is soft, nontender, nondistended, no abdominal masses. No CVAT.  Genitourinary: Normal male phallus, testes are descended bilaterally and non-tender and without masses, scrotum is normal in appearance without lesions or masses, perineum is normal on inspection. Lymphatic: No lymphadenopathy Neurologic: Grossly intact, no focal deficits Psychiatric: Normal mood and affect  I have reviewed prior pt notes  I have reviewed notes from referring/previous physicians  I have reviewed urinalysis results  I have independently reviewed prior imaging  I have reviewed prior PSA results  I have reviewed prior urine culture   Impression/Assessment:  ***  Plan:  ***

## 2022-10-28 ENCOUNTER — Ambulatory Visit (INDEPENDENT_AMBULATORY_CARE_PROVIDER_SITE_OTHER): Payer: Medicare Other | Admitting: Urology

## 2022-10-28 ENCOUNTER — Encounter: Payer: Self-pay | Admitting: Urology

## 2022-10-28 VITALS — BP 122/77 | HR 77

## 2022-10-28 DIAGNOSIS — R972 Elevated prostate specific antigen [PSA]: Secondary | ICD-10-CM

## 2022-10-28 DIAGNOSIS — N5201 Erectile dysfunction due to arterial insufficiency: Secondary | ICD-10-CM

## 2022-10-28 DIAGNOSIS — N400 Enlarged prostate without lower urinary tract symptoms: Secondary | ICD-10-CM

## 2022-10-28 LAB — URINALYSIS, ROUTINE W REFLEX MICROSCOPIC
Bilirubin, UA: NEGATIVE
Glucose, UA: NEGATIVE
Leukocytes,UA: NEGATIVE
Nitrite, UA: NEGATIVE
Protein,UA: NEGATIVE
RBC, UA: NEGATIVE
Specific Gravity, UA: 1.02 (ref 1.005–1.030)
Urobilinogen, Ur: 1 mg/dL (ref 0.2–1.0)
pH, UA: 6 (ref 5.0–7.5)

## 2022-10-28 MED ORDER — SILDENAFIL CITRATE 100 MG PO TABS: ORAL_TABLET | ORAL | 11 refills | Status: DC

## 2022-11-05 DIAGNOSIS — H25813 Combined forms of age-related cataract, bilateral: Secondary | ICD-10-CM | POA: Diagnosis not present

## 2023-02-24 ENCOUNTER — Encounter (HOSPITAL_COMMUNITY): Payer: Self-pay

## 2023-02-24 ENCOUNTER — Emergency Department (HOSPITAL_COMMUNITY)
Admission: EM | Admit: 2023-02-24 | Discharge: 2023-02-24 | Disposition: A | Discharge status: Home / Self Care | Payer: Medicare Other | Attending: Emergency Medicine | Admitting: Emergency Medicine

## 2023-02-24 ENCOUNTER — Emergency Department (HOSPITAL_COMMUNITY): Payer: Medicare Other

## 2023-02-24 ENCOUNTER — Other Ambulatory Visit: Payer: Self-pay

## 2023-02-24 DIAGNOSIS — K529 Noninfective gastroenteritis and colitis, unspecified: Secondary | ICD-10-CM | POA: Diagnosis not present

## 2023-02-24 DIAGNOSIS — I10 Essential (primary) hypertension: Secondary | ICD-10-CM | POA: Diagnosis not present

## 2023-02-24 DIAGNOSIS — N400 Enlarged prostate without lower urinary tract symptoms: Secondary | ICD-10-CM | POA: Diagnosis not present

## 2023-02-24 DIAGNOSIS — R1030 Lower abdominal pain, unspecified: Secondary | ICD-10-CM | POA: Diagnosis not present

## 2023-02-24 DIAGNOSIS — Z79899 Other long term (current) drug therapy: Secondary | ICD-10-CM | POA: Diagnosis not present

## 2023-02-24 DIAGNOSIS — K922 Gastrointestinal hemorrhage, unspecified: Secondary | ICD-10-CM | POA: Diagnosis present

## 2023-02-24 DIAGNOSIS — N281 Cyst of kidney, acquired: Secondary | ICD-10-CM | POA: Diagnosis not present

## 2023-02-24 DIAGNOSIS — K4020 Bilateral inguinal hernia, without obstruction or gangrene, not specified as recurrent: Secondary | ICD-10-CM | POA: Diagnosis not present

## 2023-02-24 LAB — ABO/RH: ABO/RH(D): A NEG

## 2023-02-24 LAB — CBC
HCT: 42.6 % (ref 39.0–52.0)
Hemoglobin: 14.2 g/dL (ref 13.0–17.0)
MCH: 28.4 pg (ref 26.0–34.0)
MCHC: 33.3 g/dL (ref 30.0–36.0)
MCV: 85.2 fL (ref 80.0–100.0)
Platelets: 259 10*3/uL (ref 150–400)
RBC: 5 MIL/uL (ref 4.22–5.81)
RDW: 13.1 % (ref 11.5–15.5)
WBC: 11.3 10*3/uL — ABNORMAL HIGH (ref 4.0–10.5)
nRBC: 0 % (ref 0.0–0.2)

## 2023-02-24 LAB — COMPREHENSIVE METABOLIC PANEL
ALT: 18 U/L (ref 0–44)
AST: 17 U/L (ref 15–41)
Albumin: 4.2 g/dL (ref 3.5–5.0)
Alkaline Phosphatase: 59 U/L (ref 38–126)
Anion gap: 12 (ref 5–15)
BUN: 25 mg/dL — ABNORMAL HIGH (ref 8–23)
CO2: 22 mmol/L (ref 22–32)
Calcium: 9.6 mg/dL (ref 8.9–10.3)
Chloride: 99 mmol/L (ref 98–111)
Creatinine, Ser: 1.06 mg/dL (ref 0.61–1.24)
GFR, Estimated: >60 mL/min (ref >60)
Glucose, Bld: 158 mg/dL — ABNORMAL HIGH (ref 70–99)
Potassium: 3.5 mmol/L (ref 3.5–5.1)
Sodium: 133 mmol/L — ABNORMAL LOW (ref 135–145)
Total Bilirubin: 0.5 mg/dL (ref 0.3–1.2)
Total Protein: 7.8 g/dL (ref 6.5–8.1)

## 2023-02-24 LAB — TYPE AND SCREEN
ABO/RH(D): A NEG
Antibody Screen: NEGATIVE

## 2023-02-24 LAB — POC OCCULT BLOOD, ED

## 2023-02-24 LAB — URINALYSIS, ROUTINE W REFLEX MICROSCOPIC
Bilirubin Urine: NEGATIVE
Glucose, UA: NEGATIVE mg/dL
Hgb urine dipstick: NEGATIVE
Ketones, ur: 5 mg/dL — AB
Leukocytes,Ua: NEGATIVE
Nitrite: NEGATIVE
Protein, ur: NEGATIVE mg/dL
Specific Gravity, Urine: 1.021 (ref 1.005–1.030)
pH: 6 (ref 5.0–8.0)

## 2023-02-24 MED ORDER — AMOXICILLIN-POT CLAVULANATE 875-125 MG PO TABS
1.0000 | ORAL_TABLET | Freq: Two times a day (BID) | ORAL | 0 refills | Status: DC
Start: 2023-02-24 — End: 2023-03-04

## 2023-02-24 MED ORDER — AMOXICILLIN-POT CLAVULANATE 875-125 MG PO TABS
1.0000 | ORAL_TABLET | Freq: Once | ORAL | Status: AC
  Administered 2023-02-24: 1 via ORAL
  Filled 2023-02-24: qty 1

## 2023-02-24 MED ORDER — GADOBUTROL 1 MMOL/ML IV SOLN
10.0000 mL | Freq: Once | INTRAVENOUS | Status: AC | PRN
  Administered 2023-02-24: 10 mL via INTRAVENOUS

## 2023-02-24 NOTE — ED Provider Notes (Signed)
[place] EMERGENCY DEPARTMENT AT [facility] Provider Note   CSN: [id number] Arrival date & time: 02/24/23  [id number]     History  Chief Complaint  Patient presents with   Rectal Bleeding    DAMARIA STOFKO is a 77 y.o. male.  Has PMH of hypertension, high cholesterol, BPH   Rectal Bleeding      Home Medications Prior to Admission medications   Medication Sig Start Date End Date Taking? Authorizing Provider  amLODipine (NORVASC) 5 MG tablet Take 1 tablet (5 mg total) by mouth daily. 09/10/22   Tommie Sams, DO  benazepril (LOTENSIN) 20 MG tablet Take 1 tablet (20 mg total) by mouth daily. 09/10/22   Tommie Sams, DO  doxazosin (CARDURA) 2 MG tablet Take 1 tablet (2 mg total) by mouth daily. 09/10/22   Tommie Sams, DO  Garlic 300 MG CAPS Take 600 mg by mouth.    [provider]  indapamide (LOZOL) 2.5 MG tablet Take 1 tablet (2.5 mg total) by mouth daily. 09/10/22   Tommie Sams, DO  sildenafil (VIAGRA) 100 MG tablet 1/2 to 1 tablet p.o. as needed 10/28/22   Marcine Matar, MD      Allergies    Patient has no known allergies.    Review of Systems   Review of Systems  Gastrointestinal:  Positive for hematochezia.    Physical Exam Updated Vital Signs BP (!) 153/78   Pulse 60   Temp 98 F (36.7 C) (Oral)   Resp 18   Ht 6' (1.829 m)   Wt 95.3 kg   SpO2 98%   BMI 28.48 kg/m  Physical Exam Vitals and nursing note reviewed. Exam conducted with a chaperone present.  Constitutional:      General: He is not in acute distress.    Appearance: He is well-developed.  HENT:     Head: Normocephalic and atraumatic.  Eyes:     Conjunctiva/sclera: Conjunctivae normal.  Cardiovascular:     Rate and Rhythm: Normal rate and regular rhythm.     Heart sounds: No murmur heard. Pulmonary:     Effort: Pulmonary effort is normal. No respiratory distress.     Breath sounds: Normal breath sounds.  Abdominal:     Palpations: Abdomen is soft.     Tenderness:  There is abdominal tenderness in the suprapubic area and left lower quadrant. There is no guarding or rebound.  Genitourinary:    Rectum: Normal. No tenderness, anal fissure, external hemorrhoid or internal hemorrhoid.     Comments: Scant brown stool on glove, trace heme positive.  Musculoskeletal:        General: No swelling.     Cervical back: Neck supple.  Skin:    General: Skin is warm and dry.     Capillary Refill: Capillary refill takes less than 2 seconds.  Neurological:     Mental Status: He is alert.  Psychiatric:        Mood and Affect: Mood normal.     ED Results / Procedures / Treatments   Labs (all labs ordered are listed, but only abnormal results are displayed) Labs Reviewed  COMPREHENSIVE METABOLIC PANEL - Abnormal; Notable for the following components:      Result Value   Sodium 133 (*)    Glucose, Bld 158 (*)    BUN 25 (*)    All other components within normal limits  CBC - Abnormal; Notable for the following components:   WBC 11.3 (*)  All other components within normal limits  POC OCCULT BLOOD, ED  TYPE AND SCREEN  ABO/RH    EKG None  Radiology No results found.  Procedures Procedures    Medications Ordered in ED Medications - No data to display  ED Course/ Medical Decision Making/ A&P                                 Medical Decision Making This patient presents to the ED for concern of GI bleeding  Blood per rectum, abdominal cramping, this involves an extensive number of treatment options, and is a complaint that carries with it a high risk of complications and morbidity.  The differential diagnosis includes colitis, diverticulitis, hemorrhoids, perforated bowel disease, malignancy, other    Additional history obtained:  Additional history obtained from EMR External records from outside source obtained and reviewed including notes, labs   ED course: Patient had right red blood per rectum this morning, no further bleeding while  in the ER states is a small amount in the toilet and enough to soak through the toilet paper but no further bleeding, having some abdominal cramping last night and this morning with diarrhea.  No fevers, he does have lower abdominal tenderness though no rebound or guarding.  Unfortunately CT scanner is down MRI performed after discussion with patient who is agreeable with this, formal read is still pending but preliminary read shows findings consistent with likely colitis.  Discussed with patient awaiting formal read.  Agreeable, not having any pain declined offer of pain medicine.  Vitals and labs are reassuring, rectal exam did not reveal any gross blood, signed out to Burgess Amor, [place] pending formal MRI read.  I discussed with radiology reading room who is assigned to a body radiologist for reading.   Amount and/or Complexity of Data Reviewed Labs: ordered. Radiology: ordered.  Risk Prescription drug management.           Final Clinical Impression(s) / ED Diagnoses Final diagnoses:  None    Rx / DC Orders ED Discharge Orders     None         Josem Kaufmann 02/24/23 Fransico Michael, MD 02/26/23 [phone]

## 2023-02-24 NOTE — Discharge Instructions (Addendum)
Take your next  dose of Augmentin tomorrow morning.  Call Dr. Levon Hedger for an office visit, in the meantime however if you develop any worsening symptoms including continued or worsened bleeding, abdominal pain, lightheadedness or any other new symptom return here immediately for recheck.  As discussed your MRI test has not resulted yet, this should be available to you on your MyChart and we will also check this result in the morning and advise you further if any unexpected results are found.

## 2023-02-24 NOTE — ED Triage Notes (Signed)
Pt presents to ED with complaints of bright red rectal bleeding. Pt states last night he woke up and had lower abdominal cramping, went to restroom and had diarrhea and then again this am with bright red blood. Pt states he felt dizzy and diaphoretic last night with the cramping.

## 2023-02-24 NOTE — ED Provider Triage Note (Signed)
Emergency Medicine Provider Triage Evaluation Note  Richard Becker , a 77 y.o. male  was evaluated in triage.  Pt complains of blood in the stool this morning with blood on the toilet paper.  Small amount in the toilet.  Patient not on blood thinners, had some diarrhea last night, some lower abdominal cramping this morning..  Review of Systems  Positive: Abdominal cramping, diarrhea, bright red blood in the stool Negative: Fever, vomiting  Physical Exam  BP (!) 168/86 (BP Location: Right Arm)   Pulse 74   Temp 98 F (36.7 C) (Oral)   Resp 18   Ht 6' (1.829 m)   Wt 95.3 kg   SpO2 99%   BMI 28.48 kg/m  Gen:   Awake, no distress   Resp:  Normal effort  MSK:   Moves extremities without difficulty  Other:    Medical Decision Making  Medically screening exam initiated at 10:45 AM.  Appropriate orders placed.  Richard Becker was informed that the remainder of the evaluation will be completed by another provider, this initial triage assessment does not replace that evaluation, and the importance of remaining in the ED until their evaluation is complete.     Richard Becker A, [place] 02/24/23 1048

## 2023-02-24 NOTE — ED Provider Notes (Signed)
Patient signed out to me at shift change from [facility], [place].  He had rectal bleeding times several occurrence in the past 24 hours, he is faintly Hemoccult positive on today's exam.  He denies abdominal pain, dizziness, nausea or vomiting, abdominal distention.  His labs are reassuring with a normal hemoglobin of 14.2.  Pending official MRI reading although the preliminary read suggests a left colitis.  Initially he wanted to wait for this reading, but got tired of waiting, had no further symptoms, he tolerated p.o. intake and was ready for discharge home.  He is started on Augmentin.  He was advised that he will be notified if his MRI findings show any unexpected results.  Otherwise he was advised he will need close follow-up with GI, referral given to Dr. Levon Hedger.  Strict return precautions were also given if symptoms persist or worsen in any way.   Burgess Amor, PA-C 02/24/23 2127    Terrilee Files, MD 02/25/23 1002

## 2023-02-25 ENCOUNTER — Telehealth (HOSPITAL_COMMUNITY): Payer: Self-pay | Admitting: Physician Assistant

## 2023-02-25 NOTE — Telephone Encounter (Cosign Needed)
Spoke with patient and gave him his formal MRI results. Discussed the colitis, need for outpatient GI follow-up as he had been instructed yesterday,informed of incidental pancreatic lesions with radiology recommendation of repeat MRI and MRCP in 2 years .  Patient verbalized understanding, denies any worsening symptoms.

## 2023-02-27 ENCOUNTER — Ambulatory Visit (INDEPENDENT_AMBULATORY_CARE_PROVIDER_SITE_OTHER): Payer: Medicare Other | Admitting: Family Medicine

## 2023-02-27 VITALS — BP 118/78 | Ht 72.0 in | Wt 210.6 lb

## 2023-02-27 DIAGNOSIS — E782 Mixed hyperlipidemia: Secondary | ICD-10-CM | POA: Diagnosis not present

## 2023-02-27 DIAGNOSIS — K529 Noninfective gastroenteritis and colitis, unspecified: Secondary | ICD-10-CM | POA: Diagnosis not present

## 2023-02-27 DIAGNOSIS — D490 Neoplasm of unspecified behavior of digestive system: Secondary | ICD-10-CM | POA: Insufficient documentation

## 2023-02-27 DIAGNOSIS — Z23 Encounter for immunization: Secondary | ICD-10-CM

## 2023-02-27 DIAGNOSIS — I10 Essential (primary) hypertension: Secondary | ICD-10-CM

## 2023-02-27 NOTE — Progress Notes (Signed)
Subjective:  Patient ID: Richard Becker, male    DOB: 05-31-1945  Age: 77 y.o. MRN: [id number]  CC: Chief Complaint  Patient presents with   Hospitalization Follow-up    Colitis    HPI:  77 year old male presents for follow-up.  Recently went to the ER for rectal bleeding and associated abdominal cramping.  CT was not available.  MRI imaging was obtained revealed findings consistent with descending and proximal sigmoid colitis.  It also showed a few subcentimeter cystic foci in the pancreatic head and uncinate process consistent with IPMN.  Follow-up MRI imaging was recommended in 2 years.  Patient needs follow-up with GI.   Patient reports that his symptoms are improved.  He is feeling better although he is not back to his normal self.  He is on Augmentin.  Advised to finish antibiotic course.  Needs referral to GI.  Hypertension stable on amlodipine, benazepril, and indapamide.  BPH stable on doxazosin.  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   Dupuytren's contracture 03/11/2022   Dry skin dermatitis 03/11/2022   BPH (benign prostatic hyperplasia) 07/30/2021   S/P laparoscopic appendectomy 07/30/2021   Health care maintenance 03/05/2021   Essential hypertension, benign 09/21/2012   Hyperlipidemia, mixed 09/21/2012    Social Hx   Social History   Socioeconomic History   Marital status: Single    Spouse name: Not on file   Number of children: Not on file   Years of education: Not on file   Highest education level: Not on file  Occupational History   Not on file  Tobacco Use   Smoking status: Never   Smokeless tobacco: Never  Substance and Sexual Activity   Alcohol use: No   Drug use: No   Sexual activity: Not on file  Other Topics Concern   Not on file  Social History Narrative   Not on file   Social Determinants of Health   Financial Resource Strain: Not on  file  Food Insecurity: No Food Insecurity (02/14/2022)   Hunger Vital Sign    Worried About Running Out of Food in the Last Year: Never true    Ran Out of Food in the Last Year: Never true  Transportation Needs: No Transportation Needs (02/14/2022)   PRAPARE - Administrator, Civil Service (Medical): No    Lack of Transportation (Non-Medical): No  Physical Activity: Not on file  Stress: Not on file  Social Connections: Not on file    Review of Systems Per HPI  Objective:  BP 118/78   Ht 6' (1.829 m)   Wt 210 lb 9.6 oz (95.5 kg)   BMI 28.56 kg/m      02/27/2023    1:58 PM 02/24/2023    7:22 PM 02/24/2023    6:21 PM  BP/Weight  Systolic BP 118 162 148  Diastolic BP 78 74 72  Wt. (Lbs) 210.6    BMI 28.56 kg/m2      Physical Exam Vitals and nursing note reviewed.  Constitutional:      General: He is not in acute distress.    Appearance: Normal appearance.  HENT:     Head: Normocephalic and atraumatic.  Cardiovascular:     Rate and Rhythm: Normal rate and regular rhythm.  Pulmonary:     Effort: Pulmonary effort is normal.     Breath sounds: Normal breath sounds. No wheezing or rales.  Abdominal:  General: There is no distension.     Palpations: Abdomen is soft.     Tenderness: There is no abdominal tenderness.  Neurological:     Mental Status: He is alert.  Psychiatric:        Mood and Affect: Mood normal.        Behavior: Behavior normal.     Lab Results  Component Value Date   WBC 11.3 (H) 02/24/2023   HGB 14.2 02/24/2023   HCT 42.6 02/24/2023   PLT 259 02/24/2023   GLUCOSE 158 (H) 02/24/2023   CHOL 206 (H) 09/04/2022   TRIG 85 09/04/2022   HDL 47 09/04/2022   LDLCALC 144 (H) 09/04/2022   ALT 18 02/24/2023   AST 17 02/24/2023   NA 133 (L) 02/24/2023   K 3.5 02/24/2023   CL 99 02/24/2023   CREATININE 1.06 02/24/2023   BUN 25 (H) 02/24/2023   CO2 22 02/24/2023   PSA 3.26 04/13/2014     Assessment & Plan:   Problem List Items  Addressed This Visit       Cardiovascular and Mediastinum   Essential hypertension, benign    Stable on current medications.  Continue current medications.        Digestive   IPMN (intraductal papillary mucinous neoplasm)    Needs follow-up MRI imaging.  I am not sure that I would wait 2 years.  Will see what GI recommends in regards to this.      Relevant Orders   Ambulatory referral to Gastroenterology   Colitis - Primary    Improving.  Finish antibiotic therapy.  Needs to see GI for colonoscopy.  Placing referral.      Relevant Orders   Ambulatory referral to Gastroenterology     Other   Hyperlipidemia, mixed    Will assess with labs at follow-up.      Other Visit Diagnoses     Need for vaccination       Relevant Orders   Flu Vaccine Trivalent High Dose (Fluad) (Completed)       Follow-up:  Return in about 6 months (around 08/28/2023).  Everlene Other DO [facility]

## 2023-02-27 NOTE — Assessment & Plan Note (Signed)
Stable on current medications.  Continue current medications.

## 2023-02-27 NOTE — Patient Instructions (Addendum)
Finish antibiotic.  Referral placed.  Follow up in 6 months.

## 2023-02-27 NOTE — Assessment & Plan Note (Signed)
Will assess with labs at follow-up.

## 2023-02-27 NOTE — Assessment & Plan Note (Signed)
Improving.  Finish antibiotic therapy.  Needs to see GI for colonoscopy.  Placing referral.

## 2023-02-27 NOTE — Assessment & Plan Note (Signed)
Needs follow-up MRI imaging.  I am not sure that I would wait 2 years.  Will see what GI recommends in regards to this.

## 2023-03-02 ENCOUNTER — Encounter (INDEPENDENT_AMBULATORY_CARE_PROVIDER_SITE_OTHER): Payer: Self-pay | Admitting: *Deleted

## 2023-03-04 ENCOUNTER — Encounter: Payer: Self-pay | Admitting: Internal Medicine

## 2023-03-04 ENCOUNTER — Ambulatory Visit (INDEPENDENT_AMBULATORY_CARE_PROVIDER_SITE_OTHER): Payer: Medicare Other | Admitting: Internal Medicine

## 2023-03-04 ENCOUNTER — Ambulatory Visit (INDEPENDENT_AMBULATORY_CARE_PROVIDER_SITE_OTHER): Payer: Medicare Other | Admitting: Gastroenterology

## 2023-03-04 VITALS — BP 131/78 | HR 66 | Temp 97.6°F | Ht 72.0 in | Wt 210.7 lb

## 2023-03-04 DIAGNOSIS — K529 Noninfective gastroenteritis and colitis, unspecified: Secondary | ICD-10-CM

## 2023-03-04 DIAGNOSIS — K5289 Other specified noninfective gastroenteritis and colitis: Secondary | ICD-10-CM

## 2023-03-04 DIAGNOSIS — D136 Benign neoplasm of pancreas: Secondary | ICD-10-CM | POA: Diagnosis not present

## 2023-03-04 DIAGNOSIS — K625 Hemorrhage of anus and rectum: Secondary | ICD-10-CM

## 2023-03-04 DIAGNOSIS — D490 Neoplasm of unspecified behavior of digestive system: Secondary | ICD-10-CM

## 2023-03-04 NOTE — Progress Notes (Signed)
Primary Care Physician:  Tommie Sams, DO Primary Gastroenterologist:  Dr. Marletta Lor  Chief Complaint  Patient presents with   New Patient (Initial Visit)    Pt referred for colitis    HPI:   Richard Becker is a 77 y.o. male who presents to clinic today by referral from his PCP Dr. Adriana Simas for evaluation.  Patient states he was in his normal state of health when he had sudden onset of abdominal pain, primarily suprapubic, rectal bleeding, diarrhea.  Presented to [facility], ER 02/24/2023.  Unfortunately CT scanner was down so patient underwent MRI of his abdomen pelvis.  MRI which I personally reviewed showed findings consistent with descending and proximal sigmoid colitis.  Also noted to subcentimeter likely IPMN's of the pancreas.  Patient was given prescription for Augmentin and discharged home.  Today, reports that his symptoms have resolved.  This week, having normal bowel movements.  No further bleeding.  No further abdominal pain.  Last colonoscopy 07/20/2014 unremarkable besides small hemorrhoids and anal papilla.  Patient denies any family history of colorectal malignancy.  Denies any upper GI symptoms including heartburn, reflux, dysphagia/odynophagia, epigastric or chest pain.  Past Medical History:  Diagnosis Date   Arthritis    Colon polyp    Hypertension    IFG (impaired fasting glucose)     Past Surgical History:  Procedure Laterality Date   Bilateral hand surgery     COLONOSCOPY     COLONOSCOPY N/A 07/20/2014   Procedure: COLONOSCOPY;  Surgeon: Malissa Hippo, MD;  Location: [facility];  Service: Endoscopy;  Laterality: N/A;  830   LAPAROSCOPIC APPENDECTOMY N/A 07/30/2021   Procedure: APPENDECTOMY LAPAROSCOPIC;  Surgeon: [name], MD;  Location: [facility];  Service: General;  Laterality: N/A;    Current Outpatient Medications  Medication Sig Dispense Refill   amLODipine (NORVASC) 5 MG tablet Take 1 tablet (5 mg total) by mouth daily. 90 tablet 3    benazepril (LOTENSIN) 20 MG tablet Take 1 tablet (20 mg total) by mouth daily. 90 tablet 3   doxazosin (CARDURA) 2 MG tablet Take 1 tablet (2 mg total) by mouth daily. (Patient taking differently: Take 2 mg by mouth at bedtime.) 90 tablet 3   Garlic 300 MG CAPS Take 600 mg by mouth.     indapamide (LOZOL) 2.5 MG tablet Take 1 tablet (2.5 mg total) by mouth daily. 90 tablet 3   amoxicillin-clavulanate (AUGMENTIN) 875-125 MG tablet Take 1 tablet by mouth every 12 (twelve) hours. (Patient not taking: Reported on 03/04/2023) 14 tablet 0   No current facility-administered medications for this visit.    Allergies as of 03/04/2023   (No Known Allergies)    Family History  Problem Relation Age of Onset   Cancer Mother        Breast   Heart attack Father     Social History   Socioeconomic History   Marital status: Single    Spouse name: Not on file   Number of children: Not on file   Years of education: Not on file   Highest education level: Not on file  Occupational History   Not on file  Tobacco Use   Smoking status: Never   Smokeless tobacco: Never  Substance and Sexual Activity   Alcohol use: No   Drug use: No   Sexual activity: Not on file  Other Topics Concern   Not on file  Social History Narrative   Not on file   Social  Determinants of Health   Financial Resource Strain: Not on file  Food Insecurity: No Food Insecurity (02/14/2022)   Hunger Vital Sign    Worried About Running Out of Food in the Last Year: Never true    Ran Out of Food in the Last Year: Never true  Transportation Needs: No Transportation Needs (02/14/2022)   PRAPARE - Administrator, Civil Service (Medical): No    Lack of Transportation (Non-Medical): No  Physical Activity: Not on file  Stress: Not on file  Social Connections: Not on file  Intimate Partner Violence: Not on file    Subjective: Review of Systems  Constitutional:  Negative for chills and fever.  HENT:  Negative for  congestion and hearing loss.   Eyes:  Negative for blurred vision and double vision.  Respiratory:  Negative for cough and shortness of breath.   Cardiovascular:  Negative for chest pain and palpitations.  Gastrointestinal:  Negative for abdominal pain, blood in stool, constipation, diarrhea, heartburn, melena and vomiting.  Genitourinary:  Negative for dysuria and urgency.  Musculoskeletal:  Negative for joint pain and myalgias.  Skin:  Negative for itching and rash.  Neurological:  Negative for dizziness and headaches.  Psychiatric/Behavioral:  Negative for depression. The patient is not nervous/anxious.        Objective: BP 131/78   Pulse 66   Temp 97.6 F (36.4 C)   Ht 6' (1.829 m)   Wt 210 lb 11.2 oz (95.6 kg)   BMI 28.58 kg/m  Physical Exam Constitutional:      Appearance: Normal appearance.  HENT:     Head: Normocephalic and atraumatic.  Eyes:     Extraocular Movements: Extraocular movements intact.     Conjunctiva/sclera: Conjunctivae normal.  Cardiovascular:     Rate and Rhythm: Normal rate and regular rhythm.  Pulmonary:     Effort: Pulmonary effort is normal.     Breath sounds: Normal breath sounds.  Abdominal:     General: Bowel sounds are normal.     Palpations: Abdomen is soft.  Musculoskeletal:        General: Normal range of motion.     Cervical back: Normal range of motion and neck supple.  Skin:    General: Skin is warm.  Neurological:     General: No focal deficit present.     Mental Status: He is alert and oriented to person, place, and time.  Psychiatric:        Mood and Affect: Mood normal.        Behavior: Behavior normal.      Assessment/Plan:  1.  Colitis/rectal bleeding-rectal bleeding has resolved.  Abdominal pain improved.  Bowels back to normal.  Will plan on colonoscopy to further evaluate in 3 to 4 weeks.  The risks including infection, bleed, or perforation as well as benefits, limitations, alternatives and imponderables have  been reviewed with the patient. Questions have been answered. All parties agreeable.  2.  IPMN's-patient with evidence of 2 subcentimeter sidebranch intraductal papillary mucinous neoplasms on MRI.  Will tentatively plan on surveillance MRI in 1 year.    Thank you Dr. Adriana Simas for the kind referral.  03/04/2023 11:00 AM   Disclaimer: This note was dictated with voice recognition software. Similar sounding words can inadvertently be transcribed and may not be corrected upon review.

## 2023-03-04 NOTE — Patient Instructions (Addendum)
We will schedule you for colonoscopy in 3 to 4 weeks to evaluate your recent episode of colitis and rectal bleeding.  We will plan on MRI in 1 year to evaluate your pancreatic lesions.  It was very nice meeting both you today.  Dr. Marletta Lor

## 2023-03-10 ENCOUNTER — Telehealth: Payer: Self-pay | Admitting: *Deleted

## 2023-03-10 MED ORDER — PEG 3350-KCL-NA BICARB-NACL 420 G PO SOLR: 4000.0000 mL | Freq: Once | ORAL | 0 refills | Status: AC

## 2023-03-10 NOTE — Telephone Encounter (Signed)
Spoke with pt. Scheduled TCS with Dr. Marletta Lor ASA 2 11/19. Aware will send instructions via mychart. Rx to be sent to pharmacy.

## 2023-03-12 ENCOUNTER — Ambulatory Visit: Payer: Medicare Other | Admitting: Family Medicine

## 2023-04-05 IMAGING — CT CT ABD-PELV W/O CM
2 of 4 series · 14 of 46 positions shown, 16 images · non-contrast
Comparison: None.
COMPARISON: None.

Addendum:
CLINICAL DATA: Pain right lower quadrant



[Series 2: axial st · axial · 0.88mm/px · z∈[+1130,+1575]mm · 11 of 103 slices shown, 13 images]
[im 7/103  soft-tissue]
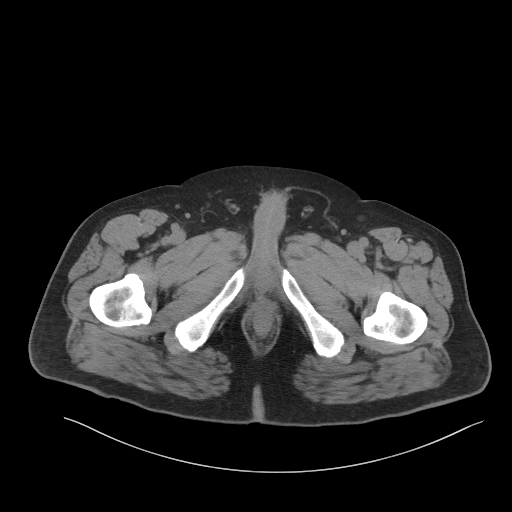
[im 7/103  bone]
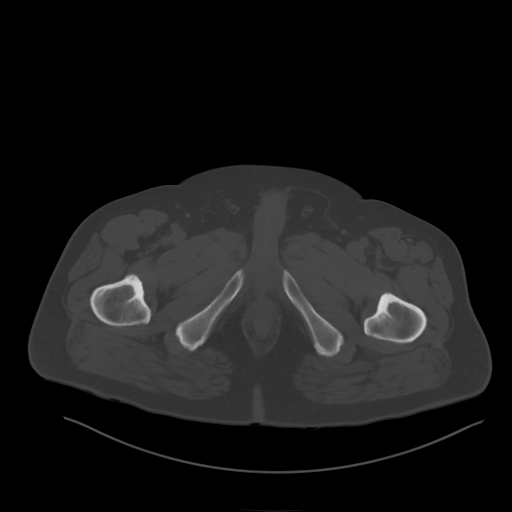
[im 20/103  soft-tissue]
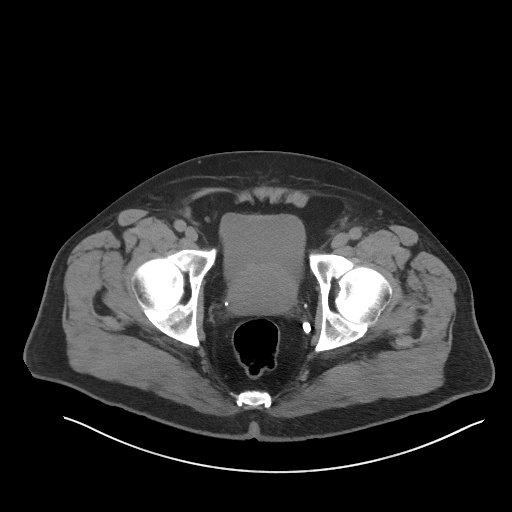
[im 26/103  soft-tissue]
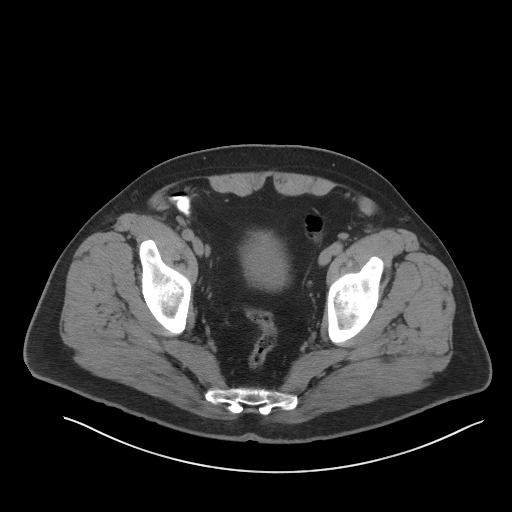
[im 32/103  soft-tissue]
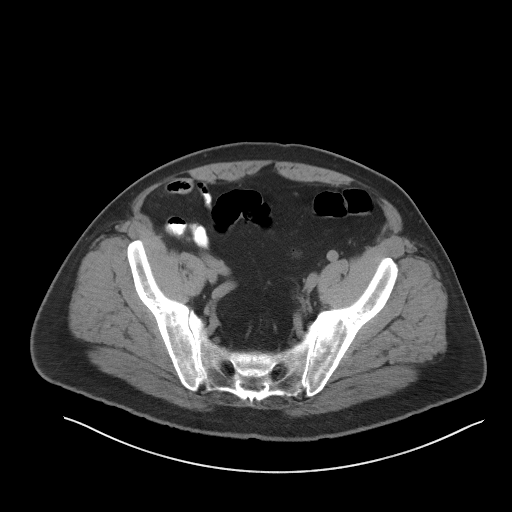
[im 45/103  soft-tissue]
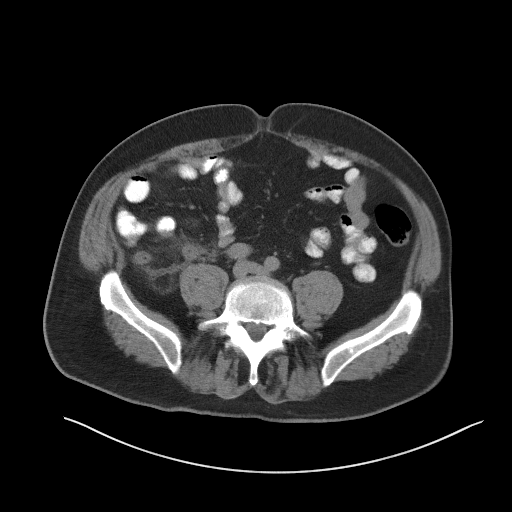
[im 52/103  soft-tissue]
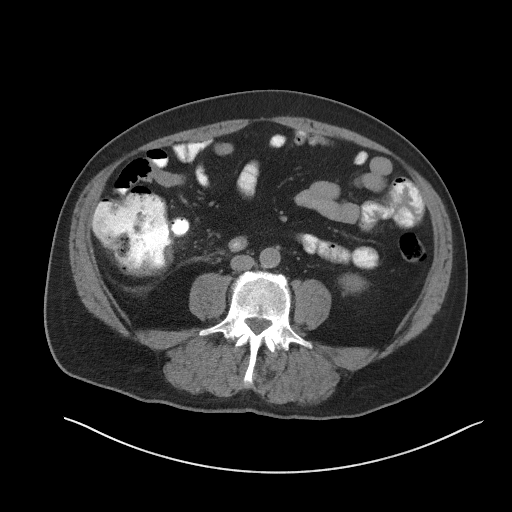
[im 58/103  soft-tissue]
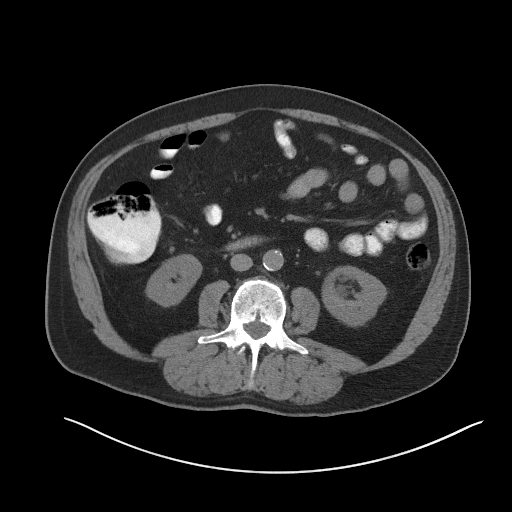
[im 71/103  soft-tissue]
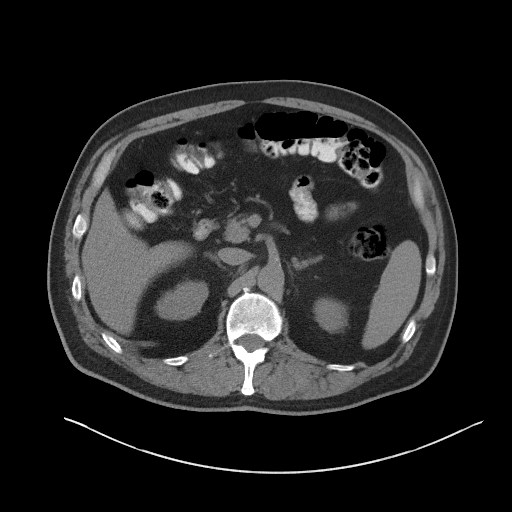
[im 77/103  soft-tissue]
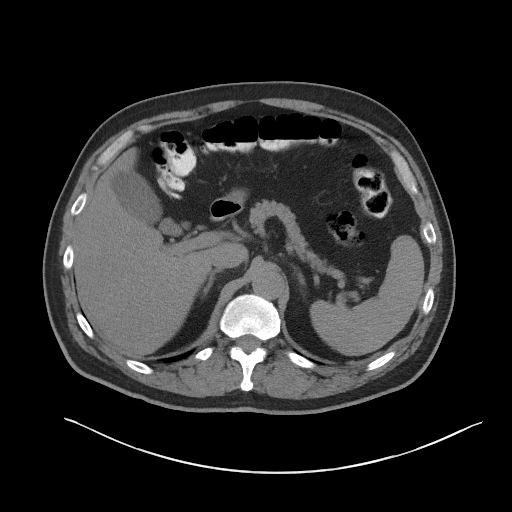
[im 77/103  bone]
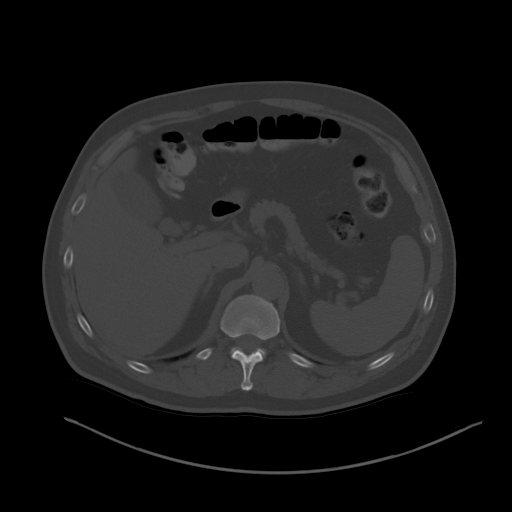
[im 83/103  soft-tissue]
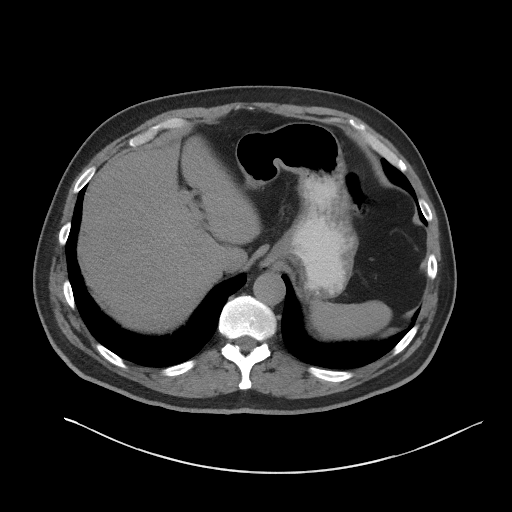
[im 96/103  soft-tissue]
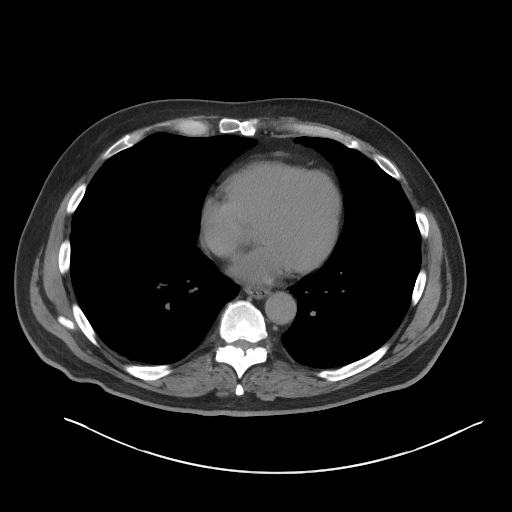

[Series 5: coronal st · coronal · 0.87mm/px · 3 of 143 slices shown]
[im 48/143  soft-tissue]
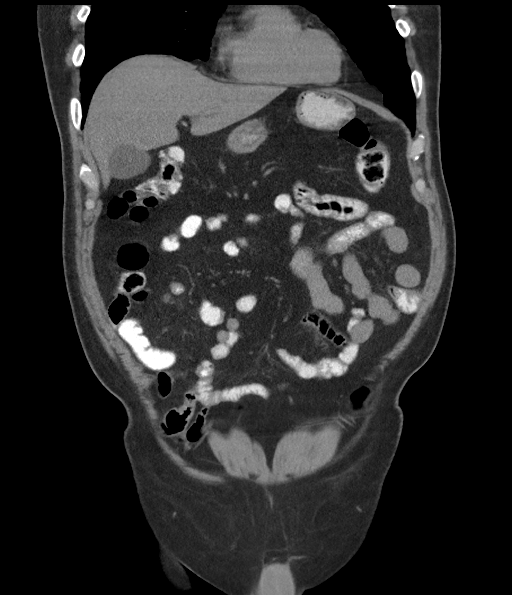
[im 64/143  soft-tissue]
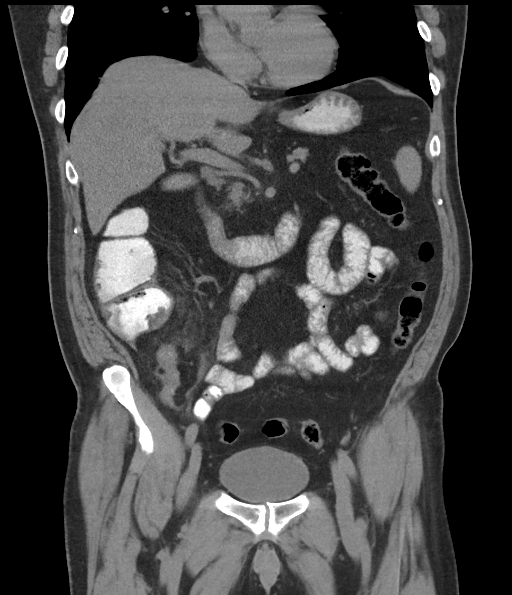
[im 79/143  soft-tissue]
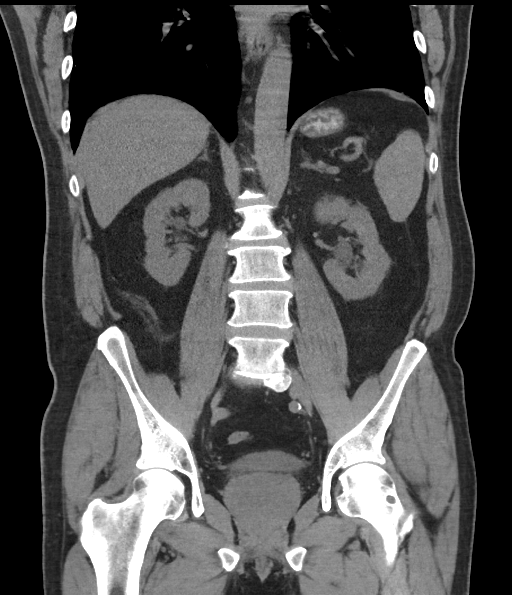

[14 of 46 positions shown; findings below may reference images not displayed]

FINDINGS: Lower chest: There is no focal pulmonary consolidation. There are
few pleural-based nodules in the periphery of right lower lobe each
measuring less than 7 mm.

Hepatobiliary: No focal abnormality is seen in the liver.
Gallbladder is unremarkable.

Pancreas: Pancreas is smaller than usual in size. No focal
abnormality is seen.

Spleen: Unremarkable.

Adrenals/Urinary Tract: Adrenals are unremarkable. There is no
hydronephrosis. There are no renal or ureteral stones. Urinary
bladder is unremarkable.

Stomach/Bowel: Stomach is unremarkable. Small hiatal hernia is seen.
Small bowel loops are not dilated. There is abnormal dilation of
appendix measuring up to 12 mm in diameter. There is wall thickening
in the appendix. There is significant pericecal stranding. There is
no loculated pericecal fluid collection. There is no significant
wall thickening in colon.

Vascular/Lymphatic: Scattered arterial calcifications are seen.

Reproductive: Prostate is enlarged.

Other: There is no ascites or pneumoperitoneum. Umbilical hernia
containing fat is seen. Bilateral inguinal hernias containing fat
are seen.

Musculoskeletal: Degenerative changes are noted at the L5-S1 level
with significant encroachment of neural foramina. There is mild
spinal stenosis and encroachment of neural foramina at L4-L5 level.
IMPRESSION: Severe acute appendicitis.  There is no loculated pericecal abscess.

There is no evidence of intestinal obstruction or pneumoperitoneum.
There is no hydronephrosis.

Fatty liver.  Lumbar spondylosis.  Enlarged prostate.

Other findings as described in the body of the report.

ADDENDUM:
These results were called to the ordering clinician or
representative by the Radiologist Assistant, and communication
documented in the PACS or [REDACTED].

*** End of Addendum ***
FINDINGS: Lower chest: There is no focal pulmonary consolidation. There are
few pleural-based nodules in the periphery of right lower lobe each
measuring less than 7 mm.

Hepatobiliary: No focal abnormality is seen in the liver.
Gallbladder is unremarkable.

Pancreas: Pancreas is smaller than usual in size. No focal
abnormality is seen.

Spleen: Unremarkable.

Adrenals/Urinary Tract: Adrenals are unremarkable. There is no
hydronephrosis. There are no renal or ureteral stones. Urinary
bladder is unremarkable.

Stomach/Bowel: Stomach is unremarkable. Small hiatal hernia is seen.
Small bowel loops are not dilated. There is abnormal dilation of
appendix measuring up to 12 mm in diameter. There is wall thickening
in the appendix. There is significant pericecal stranding. There is
no loculated pericecal fluid collection. There is no significant
wall thickening in colon.

Vascular/Lymphatic: Scattered arterial calcifications are seen.

Reproductive: Prostate is enlarged.

Other: There is no ascites or pneumoperitoneum. Umbilical hernia
containing fat is seen. Bilateral inguinal hernias containing fat
are seen.

Musculoskeletal: Degenerative changes are noted at the L5-S1 level
with significant encroachment of neural foramina. There is mild
spinal stenosis and encroachment of neural foramina at L4-L5 level.
IMPRESSION: Severe acute appendicitis.  There is no loculated pericecal abscess.

There is no evidence of intestinal obstruction or pneumoperitoneum.
There is no hydronephrosis.

Fatty liver.  Lumbar spondylosis.  Enlarged prostate.

Other findings as described in the body of the report.

## 2023-04-14 ENCOUNTER — Encounter (HOSPITAL_COMMUNITY): Payer: Self-pay

## 2023-04-14 ENCOUNTER — Ambulatory Visit (HOSPITAL_COMMUNITY): Payer: Medicare Other | Admitting: Anesthesiology

## 2023-04-14 ENCOUNTER — Other Ambulatory Visit: Payer: Self-pay

## 2023-04-14 ENCOUNTER — Encounter (HOSPITAL_COMMUNITY)
Admission: RE | Disposition: A | Discharge status: Home / Self Care | Payer: Self-pay | Source: Home / Self Care | Attending: Internal Medicine

## 2023-04-14 ENCOUNTER — Ambulatory Visit (HOSPITAL_COMMUNITY)
Admission: RE | Admit: 2023-04-14 | Discharge: 2023-04-14 | Disposition: A | Discharge status: Home / Self Care | Payer: Medicare Other | Attending: Internal Medicine | Admitting: Internal Medicine

## 2023-04-14 DIAGNOSIS — K635 Polyp of colon: Secondary | ICD-10-CM

## 2023-04-14 DIAGNOSIS — K5730 Diverticulosis of large intestine without perforation or abscess without bleeding: Secondary | ICD-10-CM | POA: Diagnosis not present

## 2023-04-14 DIAGNOSIS — Z79899 Other long term (current) drug therapy: Secondary | ICD-10-CM | POA: Insufficient documentation

## 2023-04-14 DIAGNOSIS — K648 Other hemorrhoids: Secondary | ICD-10-CM | POA: Diagnosis not present

## 2023-04-14 DIAGNOSIS — I10 Essential (primary) hypertension: Secondary | ICD-10-CM | POA: Diagnosis not present

## 2023-04-14 DIAGNOSIS — K529 Noninfective gastroenteritis and colitis, unspecified: Secondary | ICD-10-CM

## 2023-04-14 DIAGNOSIS — Z8719 Personal history of other diseases of the digestive system: Secondary | ICD-10-CM | POA: Diagnosis not present

## 2023-04-14 DIAGNOSIS — D125 Benign neoplasm of sigmoid colon: Secondary | ICD-10-CM | POA: Insufficient documentation

## 2023-04-14 DIAGNOSIS — Z860100 Personal history of colon polyps, unspecified: Secondary | ICD-10-CM | POA: Diagnosis not present

## 2023-04-14 DIAGNOSIS — Z09 Encounter for follow-up examination after completed treatment for conditions other than malignant neoplasm: Secondary | ICD-10-CM | POA: Insufficient documentation

## 2023-04-14 HISTORY — PX: POLYPECTOMY: SHX5525

## 2023-04-14 HISTORY — PX: COLONOSCOPY WITH PROPOFOL: SHX5780

## 2023-04-14 SURGERY — COLONOSCOPY WITH PROPOFOL
Anesthesia: General

## 2023-04-14 MED ORDER — LACTATED RINGERS IV SOLN: INTRAVENOUS | Status: DC

## 2023-04-14 MED ORDER — PROPOFOL 10 MG/ML IV BOLUS
INTRAVENOUS | Status: DC | PRN
  Administered 2023-04-14: 80 mg via INTRAVENOUS
  Administered 2023-04-14: 40 mg via INTRAVENOUS
  Administered 2023-04-14: 30 mg via INTRAVENOUS

## 2023-04-14 NOTE — H&P (Signed)
Primary Care Physician:  Tommie Sams, DO Primary Gastroenterologist:  Dr. Marletta Lor  Pre-Procedure History & Physical: HPI:  Richard Becker is a 77 y.o. male is here for a colonoscopy for colitis  Past Medical History:  Diagnosis Date   Arthritis    Colon polyp    Hypertension    IFG (impaired fasting glucose)     Past Surgical History:  Procedure Laterality Date   Bilateral hand surgery     COLONOSCOPY     COLONOSCOPY N/A 07/20/2014   Procedure: COLONOSCOPY;  Surgeon: Malissa Hippo, MD;  Location: [facility];  Service: Endoscopy;  Laterality: N/A;  830   LAPAROSCOPIC APPENDECTOMY N/A 07/30/2021   Procedure: APPENDECTOMY LAPAROSCOPIC;  Surgeon: [name], MD;  Location: [facility];  Service: General;  Laterality: N/A;    Prior to Admission medications   Medication Sig Start Date End Date Taking? Authorizing Provider  amLODipine (NORVASC) 5 MG tablet Take 1 tablet (5 mg total) by mouth daily. 09/10/22  Yes Cook, Jayce G, DO  benazepril (LOTENSIN) 20 MG tablet Take 1 tablet (20 mg total) by mouth daily. 09/10/22  Yes Cook, Jayce G, DO  doxazosin (CARDURA) 2 MG tablet Take 1 tablet (2 mg total) by mouth daily. Patient taking differently: Take 2 mg by mouth at bedtime. 09/10/22  Yes Cook, Jayce G, DO  Garlic 300 MG CAPS Take 600 mg by mouth.   Yes [provider]  indapamide (LOZOL) 2.5 MG tablet Take 1 tablet (2.5 mg total) by mouth daily. 09/10/22  Yes Everlene Other G, DO    Allergies as of 03/10/2023   (No Known Allergies)    Family History  Problem Relation Age of Onset   Cancer Mother        Breast   Heart attack Father     Social History   Socioeconomic History   Marital status: Single    Spouse name: Not on file   Number of children: Not on file   Years of education: Not on file   Highest education level: Not on file  Occupational History   Not on file  Tobacco Use   Smoking status: Never   Smokeless tobacco: Never  Vaping Use   Vaping status:  Never Used  Substance and Sexual Activity   Alcohol use: No   Drug use: No   Sexual activity: Not on file  Other Topics Concern   Not on file  Social History Narrative   Not on file   Social Determinants of Health   Financial Resource Strain: Not on file  Food Insecurity: No Food Insecurity (02/14/2022)   Hunger Vital Sign    Worried About Running Out of Food in the Last Year: Never true    Ran Out of Food in the Last Year: Never true  Transportation Needs: No Transportation Needs (02/14/2022)   PRAPARE - Administrator, Civil Service (Medical): No    Lack of Transportation (Non-Medical): No  Physical Activity: Not on file  Stress: Not on file  Social Connections: Not on file  Intimate Partner Violence: Not on file    Review of Systems: See HPI, otherwise negative ROS  Physical Exam: Vital signs in last 24 hours: Temp:  [97.9 F (36.6 C)] 97.9 F (36.6 C) (11/19 0847) Pulse Rate:  [75] 75 (11/19 0847) Resp:  [21] 21 (11/19 0847) BP: (135)/(79) 135/79 (11/19 0847) SpO2:  [96 %] 96 % (11/19 0847) Weight:  [95.3 kg] 95.3 kg (11/19 0835)  General:   Alert,  Well-developed, well-nourished, pleasant and cooperative in NAD Head:  Normocephalic and atraumatic. Eyes:  Sclera clear, no icterus.   Conjunctiva pink. Ears:  Normal auditory acuity. Nose:  No deformity, discharge,  or lesions. Msk:  Symmetrical without gross deformities. Normal posture. Extremities:  Without clubbing or edema. Neurologic:  Alert and  oriented x4;  grossly normal neurologically. Skin:  Intact without significant lesions or rashes. Psych:  Alert and cooperative. Normal mood and affect.  Impression/Plan: Richard Becker is here for a colonoscopy to be performed for coltis  The risks of the procedure including infection, bleed, or perforation as well as benefits, limitations, alternatives and imponderables have been reviewed with the patient. Questions have been answered. All parties  agreeable.

## 2023-04-14 NOTE — Transfer of Care (Signed)
Immediate Anesthesia Transfer of Care Note  Patient: Richard Becker  Procedure(s) Performed: COLONOSCOPY WITH PROPOFOL  Patient Location: Endoscopy Unit  Anesthesia Type:General  Level of Consciousness: awake  Airway & Oxygen Therapy: Patient Spontanous Breathing  Post-op Assessment: Report given to RN and Post -op Vital signs reviewed and stable  Post vital signs: Reviewed and stable  Last Vitals:  Vitals Value Taken Time  BP 84/61 04/14/23 1004  Temp 36.4 C 04/14/23 1004  Pulse 64 04/14/23 1004  Resp 11 04/14/23 1004  SpO2 95 % 04/14/23 1004    Last Pain:  Vitals:   04/14/23 1004  TempSrc: Oral  PainSc: 0-No pain      Patients Stated Pain Goal: 6 (04/14/23 0835)  Complications: No notable events documented.

## 2023-04-14 NOTE — Anesthesia Postprocedure Evaluation (Signed)
Anesthesia Post Note  Patient: Richard Becker  Procedure(s) Performed: COLONOSCOPY WITH PROPOFOL POLYPECTOMY  Patient location during evaluation: Phase II Anesthesia Type: General Level of consciousness: awake Pain management: pain level controlled Vital Signs Assessment: post-procedure vital signs reviewed and stable Respiratory status: spontaneous breathing and respiratory function stable Cardiovascular status: blood pressure returned to baseline and stable Postop Assessment: no headache and no apparent nausea or vomiting Anesthetic complications: no Comments: Late entry   No notable events documented.   Last Vitals:  Vitals:   04/14/23 1004 04/14/23 1009  BP: (!) 84/61 (!) 94/50  Pulse: 64 68  Resp: 11 14  Temp: 36.4 C   SpO2: 95% 93%    Last Pain:  Vitals:   04/14/23 1004  TempSrc: Oral  PainSc: 0-No pain                 Windell Norfolk

## 2023-04-14 NOTE — Op Note (Signed)
[facility] Patient Name: Richard Becker Procedure Date: 04/14/2023 9:37 AM MRN: [id number] Date of Birth: [birth year] Attending MD: Hennie Duos. Marletta Lor , [place], [id number] CSN: [id number] Age: 77 Admit Type: Outpatient Procedure:                Colonoscopy Indications:              Follow-up of colitis Providers:                Hennie Duos. Marletta Lor, DO, Buel Ream. Thomasena Edis RN, RN,                            Elinor Parkinson Referring MD:              Medicines:                See the Anesthesia note for documentation of the                            administered medications Complications:            No immediate complications. Estimated Blood Loss:     Estimated blood loss was minimal. Procedure:                Pre-Anesthesia Assessment:                           - The anesthesia plan was to use monitored                            anesthesia care (MAC).                           After obtaining informed consent, the colonoscope                            was passed under direct vision. Throughout the                            procedure, the patient's blood pressure, pulse, and                            oxygen saturations were monitored continuously. The                            [id number]-HQ190L ([id number]) scope was introduced through                            the anus and advanced to the the terminal ileum,                            with identification of the appendiceal orifice and                            IC valve. The colonoscopy was performed without                            difficulty. The patient tolerated the  procedure                            well. The quality of the bowel preparation was                            evaluated using the BBPS [facility] Bowel Preparation                            Scale) with scores of: Right Colon = 3, Transverse                            Colon = 3 and Left Colon = 3 (entire mucosa seen                            well with no residual  staining, small fragments of                            stool or opaque liquid). The total BBPS score                            equals 9. Scope In: 9:49:25 AM Scope Out: 10:01:42 AM Scope Withdrawal Time: 0 hours 10 minutes 27 seconds  Total Procedure Duration: 0 hours 12 minutes 17 seconds  Findings:      Non-bleeding internal hemorrhoids were found during endoscopy.      Multiple medium-mouthed and small-mouthed diverticula were found in the       sigmoid colon.      Two sessile polyps were found in the sigmoid colon. The polyps were 4 to       6 mm in size. These polyps were removed with a cold snare. Resection and       retrieval were complete.      There is no endoscopic evidence of inflammation in the entire colon.      The terminal ileum appeared normal. Impression:               - Non-bleeding internal hemorrhoids.                           - Diverticulosis in the sigmoid colon.                           - Two 4 to 6 mm polyps in the sigmoid colon,                            removed with a cold snare. Resected and retrieved.                           - The examined portion of the ileum was normal. Moderate Sedation:      Per Anesthesia Care Recommendation:           - Patient has a contact number available for                            emergencies. The signs and symptoms  of potential                            delayed complications were discussed with the                            patient. Return to normal activities tomorrow.                            Written discharge instructions were provided to the                            patient.                           - Resume previous diet.                           - Continue present medications.                           - Await pathology results.                           - No repeat colonoscopy due to age.                           - Return to GI clinic PRN. Procedure Code(s):        --- Professional ---                            [phone], Colonoscopy, flexible; with removal of                            tumor(s), polyp(s), or other lesion(s) by snare                            technique Diagnosis Code(s):        --- Professional ---                           K64.8, Other hemorrhoids                           D12.5, Benign neoplasm of sigmoid colon                           K52.9, Noninfective gastroenteritis and colitis,                            unspecified                           K57.30, Diverticulosis of large intestine without                            perforation or abscess without bleeding CPT copyright 2022 American Medical Association. All rights reserved. The codes documented  in this report are preliminary and upon coder review may  be revised to meet current compliance requirements. Hennie Duos. Marletta Lor, DO Hennie Duos. Marletta Lor, DO 04/14/2023 10:08:36 AM This report has been signed electronically. Number of Addenda: 0

## 2023-04-14 NOTE — Anesthesia Preprocedure Evaluation (Signed)

## 2023-04-14 NOTE — Discharge Instructions (Addendum)
  Colonoscopy Discharge Instructions  Read the instructions outlined below and refer to this sheet in the next few weeks. These discharge instructions provide you with general information on caring for yourself after you leave the hospital. Your doctor may also give you specific instructions. While your treatment has been planned according to the most current medical practices available, unavoidable complications occasionally occur.   ACTIVITY You may resume your regular activity, but move at a slower pace for the next 24 hours.  Take frequent rest periods for the next 24 hours.  Walking will help get rid of the air and reduce the bloated feeling in your belly (abdomen).  No driving for 24 hours (because of the medicine (anesthesia) used during the test).   Do not sign any important legal documents or operate any machinery for 24 hours (because of the anesthesia used during the test).  NUTRITION Drink plenty of fluids.  You may resume your normal diet as instructed by your doctor.  Begin with a light meal and progress to your normal diet. Heavy or fried foods are harder to digest and may make you feel sick to your stomach (nauseated).  Avoid alcoholic beverages for 24 hours or as instructed.  MEDICATIONS You may resume your normal medications unless your doctor tells you otherwise.  WHAT YOU CAN EXPECT TODAY Some feelings of bloating in the abdomen.  Passage of more gas than usual.  Spotting of blood in your stool or on the toilet paper.  IF YOU HAD POLYPS REMOVED DURING THE COLONOSCOPY: No aspirin products for 7 days or as instructed.  No alcohol for 7 days or as instructed.  Eat a soft diet for the next 24 hours.  FINDING OUT THE RESULTS OF YOUR TEST Not all test results are available during your visit. If your test results are not back during the visit, make an appointment with your caregiver to find out the results. Do not assume everything is normal if you have not heard from your  caregiver or the medical facility. It is important for you to follow up on all of your test results.  SEEK IMMEDIATE MEDICAL ATTENTION IF: You have more than a spotting of blood in your stool.  Your belly is swollen (abdominal distention).  You are nauseated or vomiting.  You have a temperature over 101.  You have abdominal pain or discomfort that is severe or gets worse throughout the day.   Overall, your colon appeared very healthy.  I did not see any active inflammation indicative of underlying inflammatory bowel disease such as Crohn's disease or ulcerative colitis throughout your colon or end portion of your small bowel.  Your colonoscopy revealed 2 small likely benign polyp(s) which I removed successfully. Await pathology results, my office will contact you.   Follow up with GI as needed    I hope you have a great rest of your week!  Hennie Duos. Marletta Lor, D.O. Gastroenterology and Hepatology [facility]

## 2023-04-15 LAB — SURGICAL PATHOLOGY

## 2023-04-20 ENCOUNTER — Encounter (HOSPITAL_COMMUNITY): Payer: Self-pay | Admitting: Internal Medicine

## 2023-08-13 ENCOUNTER — Telehealth: Payer: Self-pay

## 2023-08-13 NOTE — Telephone Encounter (Signed)
 Communication  Reason for CRM: need labs sent to [facility] before appt on 4/4, please call patient [phone]

## 2023-08-16 ENCOUNTER — Other Ambulatory Visit: Payer: Self-pay | Admitting: Family Medicine

## 2023-08-17 ENCOUNTER — Other Ambulatory Visit: Payer: Self-pay

## 2023-08-17 DIAGNOSIS — E782 Mixed hyperlipidemia: Secondary | ICD-10-CM

## 2023-08-17 DIAGNOSIS — I10 Essential (primary) hypertension: Secondary | ICD-10-CM

## 2023-08-17 NOTE — Telephone Encounter (Signed)
 Labs have been order pt can have them done at their convenience.  Called pt to inform, no answer

## 2023-08-24 DIAGNOSIS — I10 Essential (primary) hypertension: Secondary | ICD-10-CM | POA: Diagnosis not present

## 2023-08-24 DIAGNOSIS — E782 Mixed hyperlipidemia: Secondary | ICD-10-CM | POA: Diagnosis not present

## 2023-08-25 LAB — CBC WITH DIFFERENTIAL/PLATELET
Basophils Absolute: 0 10*3/uL (ref 0.0–0.2)
Basos: 1 %
EOS (ABSOLUTE): 0.1 10*3/uL (ref 0.0–0.4)
Eos: 2 %
Hematocrit: 43 % (ref 37.5–51.0)
Hemoglobin: 14.5 g/dL (ref 13.0–17.7)
Immature Grans (Abs): 0 10*3/uL (ref 0.0–0.1)
Immature Granulocytes: 0 %
Lymphocytes Absolute: 1.5 10*3/uL (ref 0.7–3.1)
Lymphs: 21 %
MCH: 28.7 pg (ref 26.6–33.0)
MCHC: 33.7 g/dL (ref 31.5–35.7)
MCV: 85 fL (ref 79–97)
Monocytes Absolute: 0.5 10*3/uL (ref 0.1–0.9)
Monocytes: 7 %
Neutrophils Absolute: 4.9 10*3/uL (ref 1.4–7.0)
Neutrophils: 69 %
Platelets: 291 10*3/uL (ref 150–450)
RBC: 5.05 x10E6/uL (ref 4.14–5.80)
RDW: 13.7 % (ref 11.6–15.4)
WBC: 7.1 10*3/uL (ref 3.4–10.8)

## 2023-08-25 LAB — LIPID PANEL
Chol/HDL Ratio: 5 ratio (ref 0.0–5.0)
Cholesterol, Total: 210 mg/dL — ABNORMAL HIGH (ref 100–199)
HDL: 42 mg/dL (ref >39)
LDL Chol Calc (NIH): 149 mg/dL — ABNORMAL HIGH (ref 0–99)
Triglycerides: 104 mg/dL (ref 0–149)
VLDL Cholesterol Cal: 19 mg/dL (ref 5–40)

## 2023-08-25 LAB — COMPREHENSIVE METABOLIC PANEL WITH GFR
ALT: 18 IU/L (ref 0–44)
AST: 20 IU/L (ref 0–40)
Albumin: 4.5 g/dL (ref 3.8–4.8)
Alkaline Phosphatase: 65 IU/L (ref 44–121)
BUN/Creatinine Ratio: 17 (ref 10–24)
BUN: 18 mg/dL (ref 8–27)
Bilirubin Total: 0.8 mg/dL (ref 0.0–1.2)
CO2: 22 mmol/L (ref 20–29)
Calcium: 9.5 mg/dL (ref 8.6–10.2)
Chloride: 102 mmol/L (ref 96–106)
Creatinine, Ser: 1.06 mg/dL (ref 0.76–1.27)
Globulin, Total: 2.6 g/dL (ref 1.5–4.5)
Glucose: 105 mg/dL — ABNORMAL HIGH (ref 70–99)
Potassium: 4 mmol/L (ref 3.5–5.2)
Sodium: 139 mmol/L (ref 134–144)
Total Protein: 7.1 g/dL (ref 6.0–8.5)
eGFR: 73 mL/min/{1.73_m2} (ref >59)

## 2023-08-25 LAB — MICROALBUMIN / CREATININE URINE RATIO
Creatinine, Urine: 221.8 mg/dL
Microalb/Creat Ratio: 6 mg/g{creat} (ref 0–29)
Microalbumin, Urine: 12.9 ug/mL

## 2023-08-26 ENCOUNTER — Encounter: Payer: Self-pay | Admitting: Family Medicine

## 2023-08-28 ENCOUNTER — Ambulatory Visit: Payer: Medicare Other | Admitting: Family Medicine

## 2023-08-28 ENCOUNTER — Encounter: Payer: Self-pay | Admitting: Family Medicine

## 2023-08-28 VITALS — BP 115/76 | HR 71 | Temp 97.9°F | Ht 72.0 in | Wt 211.0 lb

## 2023-08-28 DIAGNOSIS — N400 Enlarged prostate without lower urinary tract symptoms: Secondary | ICD-10-CM | POA: Diagnosis not present

## 2023-08-28 DIAGNOSIS — E782 Mixed hyperlipidemia: Secondary | ICD-10-CM | POA: Diagnosis not present

## 2023-08-28 DIAGNOSIS — I10 Essential (primary) hypertension: Secondary | ICD-10-CM

## 2023-08-28 MED ORDER — DOXAZOSIN MESYLATE 2 MG PO TABS: 2.0000 mg | ORAL_TABLET | Freq: Every day | ORAL | 3 refills | Status: AC

## 2023-08-28 MED ORDER — INDAPAMIDE 2.5 MG PO TABS: 2.5000 mg | ORAL_TABLET | Freq: Every day | ORAL | 3 refills | Status: AC

## 2023-08-28 MED ORDER — AMLODIPINE BESYLATE 5 MG PO TABS: 5.0000 mg | ORAL_TABLET | Freq: Every day | ORAL | 3 refills | Status: AC

## 2023-08-28 MED ORDER — BENAZEPRIL HCL 20 MG PO TABS: 20.0000 mg | ORAL_TABLET | Freq: Every day | ORAL | 3 refills | Status: AC

## 2023-08-28 NOTE — Patient Instructions (Signed)
 Medications refilled.  Consider treatment for cholesterol.  Follow up in 6 months.

## 2023-08-28 NOTE — Progress Notes (Signed)
 Subjective:  Patient ID: Richard Becker, male    DOB: August 26, 1945  Age: 78 y.o. MRN: [id number]  CC:   Chief Complaint  Patient presents with   Follow-up    6 month f/u Essential hypertension, benign and medication refills    HPI:  78 year old male with hypertension, IPMN, Dupuytren's contracture, BPH, hyperlipidemia presents for follow-up.  Patient reports that he is doing well.  He has no complaints concerns at this time.  Denies chest pain or shortness of breath.  Hypertension well-controlled on amlodipine, benazepril, and indapamide.  BPH stable on doxazosin.  Recent labs obtained on 3/31.  Notable for elevated total cholesterol and LDL.  ASCVD risk or is below.  Will discuss today. The 10-year ASCVD risk score (Arnett DK, et al., 2019) is: 27.2%   Values used to calculate the score:     Age: 27 years     Sex: Male     Is Non-Hispanic African American: No     Diabetic: No     Tobacco smoker: No     Systolic Blood Pressure: 115 mmHg     Is BP treated: Yes     HDL Cholesterol: 42 mg/dL     Total Cholesterol: 210 mg/dL   Patient Active Problem List   Diagnosis Date Noted   IPMN (intraductal papillary mucinous neoplasm) 02/27/2023   Dupuytren's contracture 03/11/2022   BPH (benign prostatic hyperplasia) 07/30/2021   S/P laparoscopic appendectomy 07/30/2021   Essential hypertension, benign 09/21/2012   Hyperlipidemia, mixed 09/21/2012    Social Hx   Social History   Socioeconomic History   Marital status: Single    Spouse name: Not on file   Number of children: Not on file   Years of education: Not on file   Highest education level: Not on file  Occupational History   Not on file  Tobacco Use   Smoking status: Never   Smokeless tobacco: Never  Vaping Use   Vaping status: Never Used  Substance and Sexual Activity   Alcohol use: No   Drug use: No   Sexual activity: Not on file  Other Topics Concern   Not on file  Social History Narrative   Not on  file   Social Drivers of Health   Financial Resource Strain: Not on file  Food Insecurity: No Food Insecurity (02/14/2022)   Hunger Vital Sign    Worried About Running Out of Food in the Last Year: Never true    Ran Out of Food in the Last Year: Never true  Transportation Needs: No Transportation Needs (02/14/2022)   PRAPARE - Administrator, Civil Service (Medical): No    Lack of Transportation (Non-Medical): No  Physical Activity: Not on file  Stress: Not on file  Social Connections: Not on file    Review of Systems Per HPI  Objective:  BP 115/76   Pulse 71   Temp 97.9 F (36.6 C)   Ht 6' (1.829 m)   Wt 211 lb (95.7 kg)   SpO2 98%   BMI 28.62 kg/m      08/28/2023    8:29 AM 04/14/2023   10:09 AM 04/14/2023   10:04 AM  BP/Weight  Systolic BP 115 94 84  Diastolic BP 76 50 61  Wt. (Lbs) 211    BMI 28.62 kg/m2      Physical Exam Vitals and nursing note reviewed.  Constitutional:      General: He is not in acute distress.  Appearance: Normal appearance.  HENT:     Head: Normocephalic and atraumatic.  Eyes:     General:        Right eye: No discharge.        Left eye: No discharge.     Conjunctiva/sclera: Conjunctivae normal.  Cardiovascular:     Rate and Rhythm: Normal rate and regular rhythm.  Pulmonary:     Effort: Pulmonary effort is normal.     Breath sounds: Normal breath sounds. No wheezing, rhonchi or rales.  Neurological:     Mental Status: He is alert.  Psychiatric:        Mood and Affect: Mood normal.        Behavior: Behavior normal.     Lab Results  Component Value Date   WBC 7.1 08/24/2023   HGB 14.5 08/24/2023   HCT 43.0 08/24/2023   PLT 291 08/24/2023   GLUCOSE 105 (H) 08/24/2023   CHOL 210 (H) 08/24/2023   TRIG 104 08/24/2023   HDL 42 08/24/2023   LDLCALC 149 (H) 08/24/2023   ALT 18 08/24/2023   AST 20 08/24/2023   NA 139 08/24/2023   K 4.0 08/24/2023   CL 102 08/24/2023   CREATININE 1.06 08/24/2023   BUN 18  08/24/2023   CO2 22 08/24/2023   PSA 3.26 04/13/2014     Assessment & Plan:  Hyperlipidemia, mixed Assessment & Plan: Recommended treatment.  Patient will consider.   Essential hypertension, benign Assessment & Plan: Stable.  Continue current occasions.  Medications refilled.  Orders: -     amLODIPine Besylate; Take 1 tablet (5 mg total) by mouth daily.  Dispense: 90 tablet; Refill: 3 -     Benazepril HCl; Take 1 tablet (20 mg total) by mouth daily.  Dispense: 90 tablet; Refill: 3 -     Indapamide; Take 1 tablet (2.5 mg total) by mouth daily.  Dispense: 90 tablet; Refill: 3  Benign prostatic hyperplasia, unspecified whether lower urinary tract symptoms present Assessment & Plan: Stable on doxazosin.  Continue.  Orders: -     Doxazosin Mesylate; Take 1 tablet (2 mg total) by mouth at bedtime.  Dispense: 90 tablet; Refill: 3   Follow-up:  6 months  Ethyn Schetter Adriana Simas DO [facility]

## 2023-08-31 NOTE — Assessment & Plan Note (Signed)
 Recommended treatment.  Patient will consider.

## 2023-08-31 NOTE — Assessment & Plan Note (Signed)
 Stable.  Continue current occasions.  Medications refilled.

## 2023-08-31 NOTE — Assessment & Plan Note (Signed)
 Stable on doxazosin. Continue.

## 2023-11-04 ENCOUNTER — Other Ambulatory Visit: Payer: Self-pay

## 2023-11-04 DIAGNOSIS — R972 Elevated prostate specific antigen [PSA]: Secondary | ICD-10-CM

## 2023-11-09 DIAGNOSIS — H25813 Combined forms of age-related cataract, bilateral: Secondary | ICD-10-CM | POA: Diagnosis not present

## 2023-11-23 ENCOUNTER — Other Ambulatory Visit

## 2023-11-23 DIAGNOSIS — R972 Elevated prostate specific antigen [PSA]: Secondary | ICD-10-CM | POA: Diagnosis not present

## 2023-11-24 ENCOUNTER — Ambulatory Visit: Payer: Self-pay | Admitting: Urology

## 2023-11-24 LAB — PSA: Prostate Specific Ag, Serum: 4.2 ng/mL — ABNORMAL HIGH (ref 0.0–4.0)

## 2023-12-07 NOTE — Progress Notes (Signed)
 Impression/Assessment:  1.  Elevated PSA.  Fairly stable, benign large gland.  2.  ED, organic.  Patient would like to to consider treatment  Plan:    History of Present Illness:   5.19.2020: Initial visit for E/M of elevated PSA. PSA levels have been as follows:    11.19.2015--3.26  1.5.20217--3.3  1.30.2018--3.9  1.31.2019--3.9  1.30.2020--4.2.  04/19/2019--2.3  3.2.2021--4.0. 6.7.2022-- 4.2   6.6.2023: Recent PSA 4.2.  2 mos ago it was 5.3. He had an appendectomy in March of this year.  CT scan was performed.  My calculated prostate volume based on CT prostate measurements 107 mL  6.4.2024: PSA 3.7.  He has had no real urologic issues over the past year.  He is on doxazosin , but for hypertension.  IPSS 4  QoL 1  Started on sildenafil   7.15.2025: Here today for recheck.  Recent PSA 4.2   Past Medical History:  Diagnosis Date   Arthritis    Colon polyp    Hypertension    IFG (impaired fasting glucose)     Past Surgical History:  Procedure Laterality Date   Bilateral hand surgery     COLONOSCOPY     COLONOSCOPY N/A 07/20/2014   Procedure: COLONOSCOPY;  Surgeon: Claudis RAYMOND Rivet, MD;  Location: [facility];  Service: Endoscopy;  Laterality: N/A;  830   COLONOSCOPY WITH PROPOFOL  N/A 04/14/2023   Procedure: COLONOSCOPY WITH PROPOFOL ;  Surgeon: Cindie Carlin POUR, DO;  Location: [facility];  Service: Endoscopy;  Laterality: N/A;  10:00am, asa 2   LAPAROSCOPIC APPENDECTOMY N/A 07/30/2021   Procedure: APPENDECTOMY LAPAROSCOPIC;  Surgeon: Mavis Anes, MD;  Location: [facility];  Service: General;  Laterality: N/A;   POLYPECTOMY  04/14/2023   Procedure: POLYPECTOMY;  Surgeon: Cindie Carlin POUR, DO;  Location: [facility];  Service: Endoscopy;;    Home Medications:  Allergies as of 12/08/2023   No Known Allergies      Medication List        Accurate as of December 07, 2023 12:45 PM. If you have any questions, ask your nurse or doctor.          amLODipine  5 MG  tablet Commonly known as: NORVASC  Take 1 tablet (5 mg total) by mouth daily.   benazepril  20 MG tablet Commonly known as: LOTENSIN  Take 1 tablet (20 mg total) by mouth daily.   doxazosin  2 MG tablet Commonly known as: CARDURA  Take 1 tablet (2 mg total) by mouth at bedtime.   Garlic 300 MG Caps Take 600 mg by mouth.   indapamide  2.5 MG tablet Commonly known as: LOZOL  Take 1 tablet (2.5 mg total) by mouth daily.        Allergies: No Known Allergies  Family History  Problem Relation Age of Onset   Cancer Mother        Breast   Heart attack Father     Social History:  reports that he has never smoked. He has never used smokeless tobacco. He reports that he does not drink alcohol and does not use drugs.  ROS: A complete review of systems was performed.  All systems are negative except for pertinent findings as noted.  Physical Exam:  Vital signs in last 24 hours: There were no vitals taken for this visit. Constitutional:  Alert and oriented, No acute distress Cardiovascular: Regular rate  Respiratory: Normal respiratory effor Genitourinary: Normal anal sphincter tone.  No rectal masses.  Prostate 80 g, symmetric, nonnodular, nontender. Neurologic: Grossly intact, no focal deficits  Psychiatric: Normal mood and affect  I have reviewed prior pt notes  I have reviewed urinalysis results--clear  I have independently reviewed prior imaging--CT scan from last year  I have reviewed prior PSA results

## 2023-12-08 ENCOUNTER — Encounter: Payer: Self-pay | Admitting: Urology

## 2023-12-08 ENCOUNTER — Ambulatory Visit (INDEPENDENT_AMBULATORY_CARE_PROVIDER_SITE_OTHER): Admitting: Urology

## 2023-12-08 VITALS — BP 130/76 | HR 83

## 2023-12-08 DIAGNOSIS — R972 Elevated prostate specific antigen [PSA]: Secondary | ICD-10-CM

## 2023-12-08 DIAGNOSIS — N5201 Erectile dysfunction due to arterial insufficiency: Secondary | ICD-10-CM | POA: Diagnosis not present

## 2023-12-08 DIAGNOSIS — N400 Enlarged prostate without lower urinary tract symptoms: Secondary | ICD-10-CM

## 2023-12-09 LAB — URINALYSIS, ROUTINE W REFLEX MICROSCOPIC
Bilirubin, UA: NEGATIVE
Glucose, UA: NEGATIVE
Ketones, UA: NEGATIVE
Leukocytes,UA: NEGATIVE
Nitrite, UA: NEGATIVE
Protein,UA: NEGATIVE
RBC, UA: NEGATIVE
Specific Gravity, UA: 1.025 (ref 1.005–1.030)
Urobilinogen, Ur: 1 mg/dL (ref 0.2–1.0)
pH, UA: 6 (ref 5.0–7.5)

## 2024-03-01 ENCOUNTER — Ambulatory Visit (INDEPENDENT_AMBULATORY_CARE_PROVIDER_SITE_OTHER): Admitting: Family Medicine

## 2024-03-01 ENCOUNTER — Encounter: Payer: Self-pay | Admitting: Family Medicine

## 2024-03-01 VITALS — BP 108/68 | HR 79 | Ht 72.0 in | Wt 213.0 lb

## 2024-03-01 DIAGNOSIS — N400 Enlarged prostate without lower urinary tract symptoms: Secondary | ICD-10-CM | POA: Diagnosis not present

## 2024-03-01 DIAGNOSIS — D490 Neoplasm of unspecified behavior of digestive system: Secondary | ICD-10-CM | POA: Diagnosis not present

## 2024-03-01 DIAGNOSIS — Z23 Encounter for immunization: Secondary | ICD-10-CM

## 2024-03-01 DIAGNOSIS — Z862 Personal history of diseases of the blood and blood-forming organs and certain disorders involving the immune mechanism: Secondary | ICD-10-CM

## 2024-03-01 DIAGNOSIS — I10 Essential (primary) hypertension: Secondary | ICD-10-CM

## 2024-03-01 DIAGNOSIS — E782 Mixed hyperlipidemia: Secondary | ICD-10-CM

## 2024-03-01 NOTE — Progress Notes (Signed)
 Subjective:  Patient ID: Richard Becker Mention, male    DOB: 08-Sep-1945  Age: 78 y.o. MRN: [id number]  CC:   Chief Complaint  Patient presents with   Hyperlipidemia    Six month follow up     HPI:  78 year old male presents for follow-up.  Hypertension stable on benazepril , amlodipine , & indapamide .  Patient states that he is having some fatigue and decreased energy.  Denies chest pain or shortness of breath.  Follows closely with urology.  Is on doxazosin  for BPH.  Patient has had prior abnormal imaging involving the pancreas.  Suspicious for IPMN.  Follow-up imaging was recommended in 2 years.  Patient states that he would like to proceed with sooner imaging.  Will discuss today.  Patient amenable to flu vaccine today.  Patient Active Problem List   Diagnosis Date Noted   IPMN (intraductal papillary mucinous neoplasm) 02/27/2023   Dupuytren's contracture 03/11/2022   BPH (benign prostatic hyperplasia) 07/30/2021   S/P laparoscopic appendectomy 07/30/2021   Essential hypertension, benign 09/21/2012   Hyperlipidemia, mixed 09/21/2012    Social Hx   Social History   Socioeconomic History   Marital status: Single    Spouse name: Not on file   Number of children: Not on file   Years of education: Not on file   Highest education level: Not on file  Occupational History   Not on file  Tobacco Use   Smoking status: Never   Smokeless tobacco: Never  Vaping Use   Vaping status: Never Used  Substance and Sexual Activity   Alcohol use: No   Drug use: No   Sexual activity: Not on file  Other Topics Concern   Not on file  Social History Narrative   Not on file   Social Drivers of Health   Financial Resource Strain: Not on file  Food Insecurity: No Food Insecurity (02/14/2022)   Hunger Vital Sign    Worried About Running Out of Food in the Last Year: Never true    Ran Out of Food in the Last Year: Never true  Transportation Needs: No Transportation Needs (02/14/2022)    PRAPARE - Administrator, Civil Service (Medical): No    Lack of Transportation (Non-Medical): No  Physical Activity: Not on file  Stress: Not on file  Social Connections: Not on file    Review of Systems Per HPI  Objective:  BP 108/68   Pulse 79   Ht 6' (1.829 m)   Wt 213 lb (96.6 kg)   SpO2 98%   BMI 28.89 kg/m      03/01/2024    8:37 AM 12/08/2023    3:13 PM 08/28/2023    8:29 AM  BP/Weight  Systolic BP 108 130 115  Diastolic BP 68 76 76  Wt. (Lbs) 213  211  BMI 28.89 kg/m2  28.62 kg/m2    Physical Exam Vitals and nursing note reviewed.  Constitutional:      General: He is not in acute distress.    Appearance: Normal appearance.  HENT:     Head: Normocephalic and atraumatic.  Eyes:     General:        Right eye: No discharge.        Left eye: No discharge.     Conjunctiva/sclera: Conjunctivae normal.  Cardiovascular:     Rate and Rhythm: Normal rate and regular rhythm.  Pulmonary:     Effort: Pulmonary effort is normal.     Breath sounds: Normal breath  sounds. No wheezing, rhonchi or rales.  Abdominal:     General: There is no distension.     Palpations: Abdomen is soft.     Tenderness: There is no abdominal tenderness.  Neurological:     Mental Status: He is alert.  Psychiatric:        Mood and Affect: Mood normal.        Behavior: Behavior normal.     Lab Results  Component Value Date   WBC 7.1 08/24/2023   HGB 14.5 08/24/2023   HCT 43.0 08/24/2023   PLT 291 08/24/2023   GLUCOSE 105 (H) 08/24/2023   CHOL 210 (H) 08/24/2023   TRIG 104 08/24/2023   HDL 42 08/24/2023   LDLCALC 149 (H) 08/24/2023   ALT 18 08/24/2023   AST 20 08/24/2023   NA 139 08/24/2023   K 4.0 08/24/2023   CL 102 08/24/2023   CREATININE 1.06 08/24/2023   BUN 18 08/24/2023   CO2 22 08/24/2023   PSA 3.26 04/13/2014     Assessment & Plan:  Essential hypertension, benign Assessment & Plan: Stable.  Continue her medications.  Planning for labs at  follow-up.  Orders: -     CMP14+EGFR -     Microalbumin / creatinine urine ratio  IPMN (intraductal papillary mucinous neoplasm) Assessment & Plan: Arranging for MRI/MRCP.  Orders: -     MR ABDOMEN MRCP W WO CONTAST  Encounter for immunization -     Flu vaccine HIGH DOSE PF(Fluzone  Trivalent)  Benign prostatic hyperplasia, unspecified whether lower urinary tract symptoms present Assessment & Plan: Stable on doxazosin .  Continue.   Hyperlipidemia, mixed -     Lipid panel  History of anemia -     CBC    Follow-up: 59-month  Kazue Cerro DO [facility]

## 2024-03-01 NOTE — Patient Instructions (Signed)
 Follow up in 6 months.  MRI/MRCP ordered.  Continue your medications.  Take care  Dr. Bluford

## 2024-03-01 NOTE — Assessment & Plan Note (Signed)
 Stable on doxazosin. Continue.

## 2024-03-01 NOTE — Assessment & Plan Note (Signed)
 Stable.  Continue her medications.  Planning for labs at follow-up.

## 2024-03-01 NOTE — Assessment & Plan Note (Signed)
 Arranging for MRI/MRCP.

## 2024-03-09 ENCOUNTER — Ambulatory Visit (HOSPITAL_COMMUNITY)
Admission: RE | Admit: 2024-03-09 | Discharge: 2024-03-09 | Disposition: A | Discharge status: Home / Self Care | Source: Ambulatory Visit | Attending: Family Medicine | Admitting: Family Medicine

## 2024-03-09 ENCOUNTER — Other Ambulatory Visit: Payer: Self-pay | Admitting: Family Medicine

## 2024-03-09 DIAGNOSIS — D490 Neoplasm of unspecified behavior of digestive system: Secondary | ICD-10-CM | POA: Diagnosis not present

## 2024-03-09 DIAGNOSIS — K8689 Other specified diseases of pancreas: Secondary | ICD-10-CM | POA: Diagnosis not present

## 2024-03-09 MED ORDER — GADOBUTROL 1 MMOL/ML IV SOLN
10.0000 mL | Freq: Once | INTRAVENOUS | Status: AC | PRN
Start: 2024-03-09 — End: 2024-03-09
  Administered 2024-03-09: 10 mL via INTRAVENOUS

## 2024-03-13 ENCOUNTER — Ambulatory Visit: Payer: Self-pay | Admitting: Family Medicine

## 2024-08-30 ENCOUNTER — Ambulatory Visit: Admitting: Family Medicine

## 2024-12-13 ENCOUNTER — Ambulatory Visit: Admitting: Urology
